# Patient Record
Sex: Female | Born: 1965 | Race: White | Hispanic: No | Smoking: Never smoker
Health system: Southern US, Community
[De-identification: ages and names within clinical notes are randomized; demographics above are authoritative.]

## PROBLEM LIST (undated history)

## (undated) DIAGNOSIS — R112 Nausea with vomiting, unspecified: Secondary | ICD-10-CM

## (undated) DIAGNOSIS — M1990 Unspecified osteoarthritis, unspecified site: Secondary | ICD-10-CM

## (undated) DIAGNOSIS — Z9889 Other specified postprocedural states: Secondary | ICD-10-CM

## (undated) DIAGNOSIS — R51 Headache: Secondary | ICD-10-CM

## (undated) HISTORY — PX: ABLATION: SHX5711

## (undated) HISTORY — PX: LIPOMA EXCISION: SHX5283

## (undated) HISTORY — PX: DIAGNOSTIC LAPAROSCOPY: SUR761

---

## 1987-01-05 HISTORY — PX: TUBAL LIGATION: SHX77

## 1998-07-29 ENCOUNTER — Other Ambulatory Visit: Admission: RE | Admit: 1998-07-29 | Discharge: 1998-07-29 | Payer: Self-pay | Admitting: Obstetrics and Gynecology

## 1998-08-04 ENCOUNTER — Ambulatory Visit (HOSPITAL_COMMUNITY): Admission: RE | Admit: 1998-08-04 | Discharge: 1998-08-04 | Payer: Self-pay | Admitting: Gastroenterology

## 2000-03-29 ENCOUNTER — Other Ambulatory Visit: Admission: RE | Admit: 2000-03-29 | Discharge: 2000-03-29 | Payer: Self-pay | Admitting: Obstetrics and Gynecology

## 2000-05-14 ENCOUNTER — Emergency Department (HOSPITAL_COMMUNITY): Admission: EM | Admit: 2000-05-14 | Discharge: 2000-05-14 | Payer: Self-pay | Admitting: Emergency Medicine

## 2000-05-14 ENCOUNTER — Encounter: Payer: Self-pay | Admitting: Emergency Medicine

## 2000-05-16 ENCOUNTER — Ambulatory Visit (HOSPITAL_COMMUNITY): Admission: RE | Admit: 2000-05-16 | Discharge: 2000-05-16 | Payer: Self-pay | Admitting: Specialist

## 2000-05-16 ENCOUNTER — Encounter: Payer: Self-pay | Admitting: Specialist

## 2001-04-10 ENCOUNTER — Other Ambulatory Visit: Admission: RE | Admit: 2001-04-10 | Discharge: 2001-04-10 | Payer: Self-pay | Admitting: Obstetrics and Gynecology

## 2002-03-05 HISTORY — PX: KNEE ARTHROSCOPY: SUR90

## 2002-08-31 ENCOUNTER — Encounter (INDEPENDENT_AMBULATORY_CARE_PROVIDER_SITE_OTHER): Payer: Self-pay | Admitting: Specialist

## 2002-08-31 ENCOUNTER — Ambulatory Visit (HOSPITAL_BASED_OUTPATIENT_CLINIC_OR_DEPARTMENT_OTHER): Admission: RE | Admit: 2002-08-31 | Discharge: 2002-08-31 | Payer: Self-pay | Admitting: General Surgery

## 2003-01-06 ENCOUNTER — Emergency Department (HOSPITAL_COMMUNITY): Admission: EM | Admit: 2003-01-06 | Discharge: 2003-01-06 | Payer: Self-pay | Admitting: Emergency Medicine

## 2003-12-24 ENCOUNTER — Other Ambulatory Visit: Admission: RE | Admit: 2003-12-24 | Discharge: 2003-12-24 | Payer: Self-pay | Admitting: Obstetrics and Gynecology

## 2005-08-19 ENCOUNTER — Other Ambulatory Visit: Admission: RE | Admit: 2005-08-19 | Discharge: 2005-08-19 | Payer: Self-pay | Admitting: Obstetrics and Gynecology

## 2005-11-19 ENCOUNTER — Inpatient Hospital Stay (HOSPITAL_COMMUNITY): Admission: AD | Admit: 2005-11-19 | Discharge: 2005-11-19 | Payer: Self-pay | Admitting: Obstetrics and Gynecology

## 2009-03-26 ENCOUNTER — Encounter: Admission: RE | Admit: 2009-03-26 | Discharge: 2009-03-26 | Payer: Self-pay | Admitting: Neurology

## 2009-06-30 ENCOUNTER — Emergency Department (HOSPITAL_COMMUNITY): Admission: EM | Admit: 2009-06-30 | Discharge: 2009-07-01 | Payer: Self-pay | Admitting: Emergency Medicine

## 2010-05-22 NOTE — Op Note (Signed)
   NAMEBRANDEY, Dominique Blackwell                        ACCOUNT NO.:  [id number]   MEDICAL RECORD NO.:  [id number]                   PATIENT TYPE:  AMB   LOCATION:  DSC                                  FACILITY:  [facility]   PHYSICIAN:  Gabrielle Dare. Janee Morn, M.D.             DATE OF BIRTH:  07/22/1965   DATE OF PROCEDURE:  08/31/2002  DATE OF DISCHARGE:                                 OPERATIVE REPORT   PREOPERATIVE DIAGNOSIS:  Mass, left leg.   POSTOPERATIVE DIAGNOSIS:  Mass, left leg.   PROCEDURE:  Excision of mass, left leg.   SURGEON:  Gabrielle Dare. Janee Morn, M.D.   ANESTHESIA:  MAC plus local.   INDICATIONS:  The patient is a 45 year old white female who I evaluated in  the office for an uncomfortable mass in her left lower extremity anterior to  her tibia.  The area had been getting larger, and she presents for elective  excision.   DESCRIPTION OF PROCEDURE:  Informed consent had been obtained.  The patient  had been taken to operating room.  MAC anesthesia was administered.  Marcaine 0.25% with epinephrine had been used as a local anesthetic.  Her  left lower leg was prepped and draped in a sterile fashion after she  received intravenous antibiotics.  The site had been marked by the patient.  A vertical incision was made overlying the 2 cm palpable mass.  Subcutaneous  tissue planes were entered and an area of lipomatous tissue in conjunction  with a cystic type lesion on the patient's fascia was noted.  This was  excised circumferentially including a portion of the fascia and sent to  pathology.  Further exploration of the wound revealed no other masses and no  residual abnormal tissue.  The wound was copiously irrigated.  After the  wound was checked and hemostasis was ensured, the incision was closed with a  running 4-0 Vicryl subcuticular stitch.  Sponge, needle and instrument  counts were correct.  Benzoin, Steri-Strips and sterile dressings were  applied.  The patient tolerated the  procedure well and was taken to the  recovery room in stable condition.                                               Gabrielle Dare Janee Morn, M.D.    BET/MEDQ  D:  08/31/2002  T:  09/01/2002  Job:  [id number]

## 2011-10-06 IMAGING — CT CT MAXILLOFACIAL W/O CM
4 of 8 series · 17 of 47 positions shown, 19 images · non-contrast
Comparison: None

CT HEAD

CLINICAL DATA: Fall, right face trauma.

CT HEAD WITHOUT CONTRAST
CT MAXILLOFACIAL WITHOUT CONTRAST
CT CERVICAL SPINE WITHOUT CONTRAST
TECHNIQUE: Multidetector CT imaging of the head, cervical spine,
and maxillofacial structures were performed using the standard
protocol without intravenous contrast. Multiplanar CT image
reconstructions of the cervical spine and maxillofacial structures
were also generated.

[Series 6: recon 2: cervical spine · axial · 0.34mm/px · z∈[-318,-188]mm · 5 of 80 slices shown]
[im 14/80  bone]
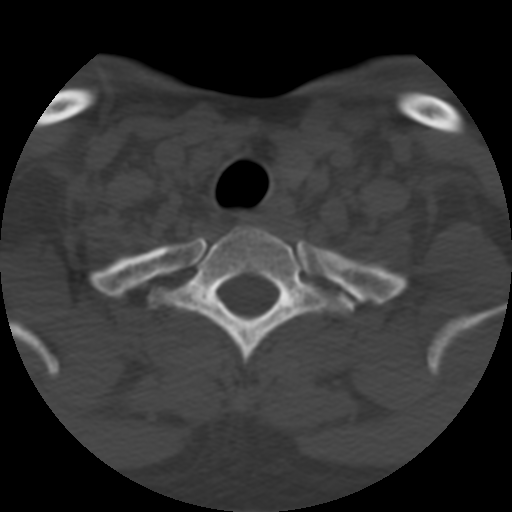
[im 27/80  bone]
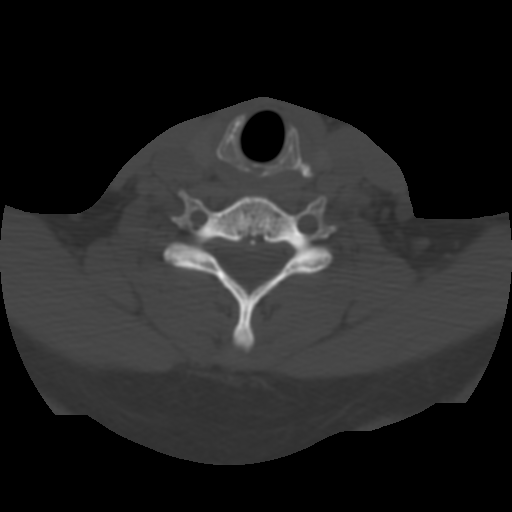
[im 40/80  bone]
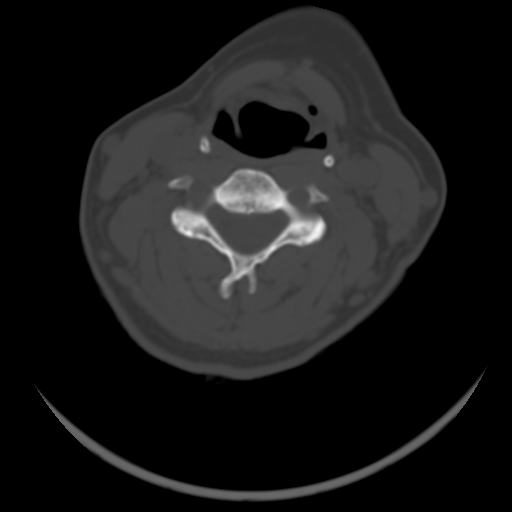
[im 53/80  bone]
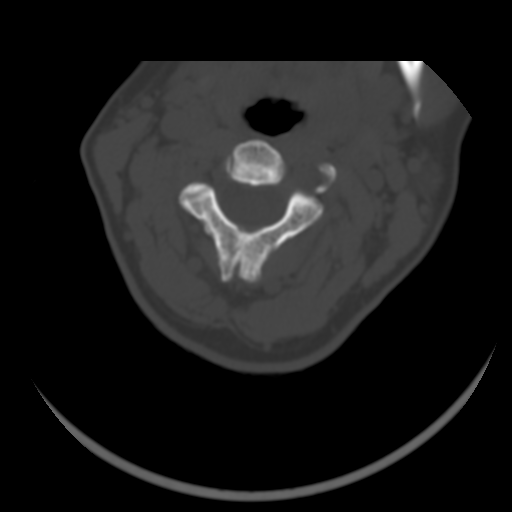
[im 66/80  bone]
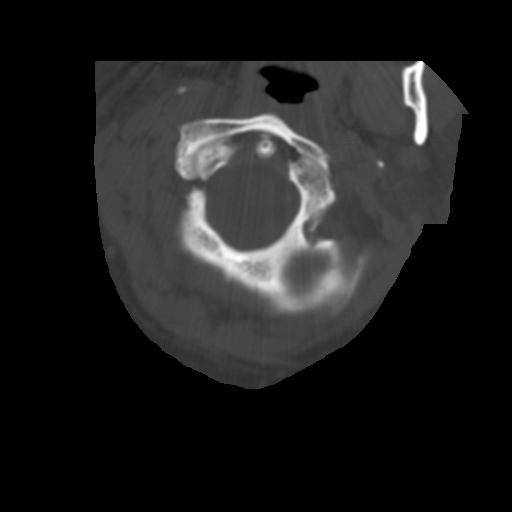

[Series 103: reformatted · coronal · 0.38mm/px · 3 of 71 slices shown (1 of 3)]
[im 15/71  bone]
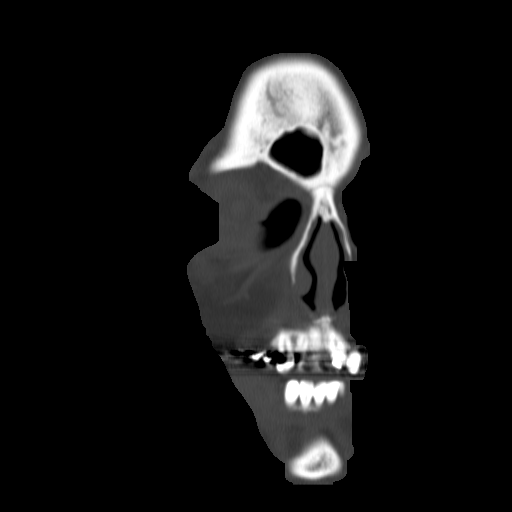
[im 29/71  bone]
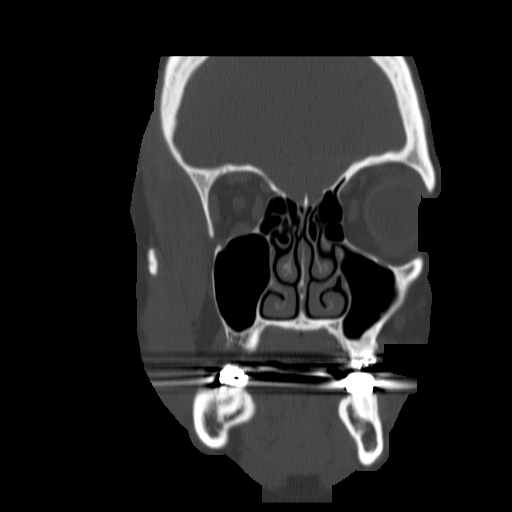
[im 43/71  bone]
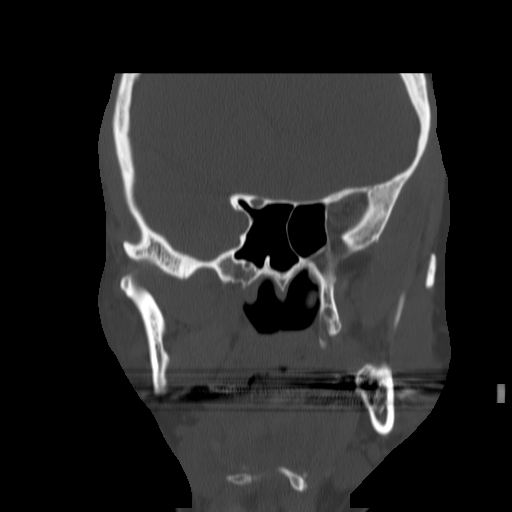

[Series 701: reformatted · sagittal · 0.40mm/px · 3 of 62 slices shown (2 of 3)]
[im 21/62  bone]
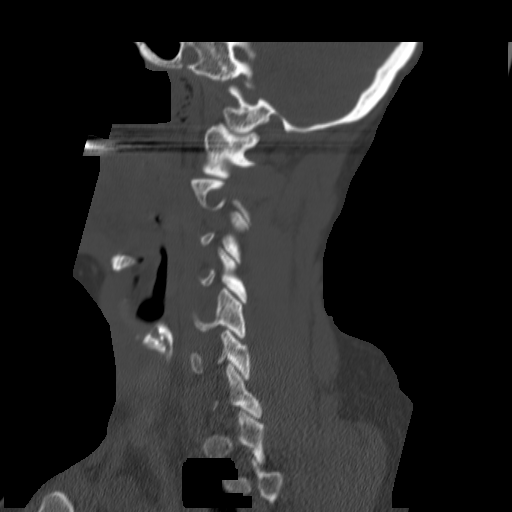
[im 31/62  bone]
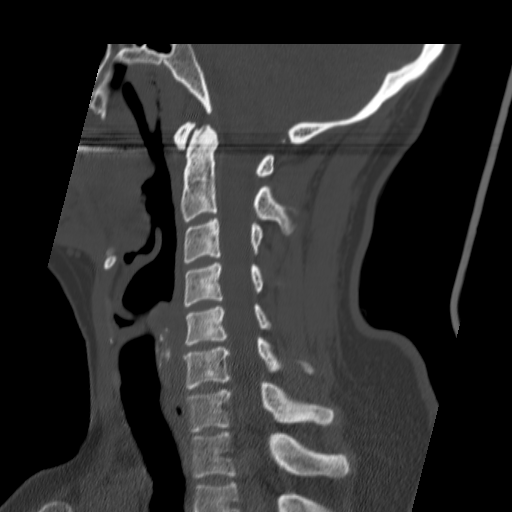
[im 41/62  bone]
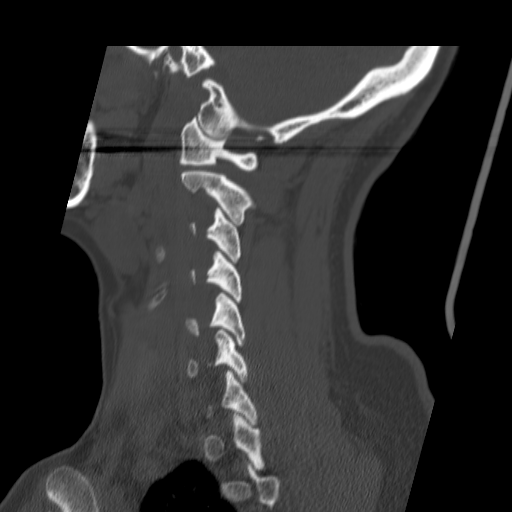

[Series 702: reformatted · axial · 0.40mm/px · z∈[-332,-212]mm · 6 of 89 slices shown, 8 images (3 of 3)]
[im 13/89  brain]
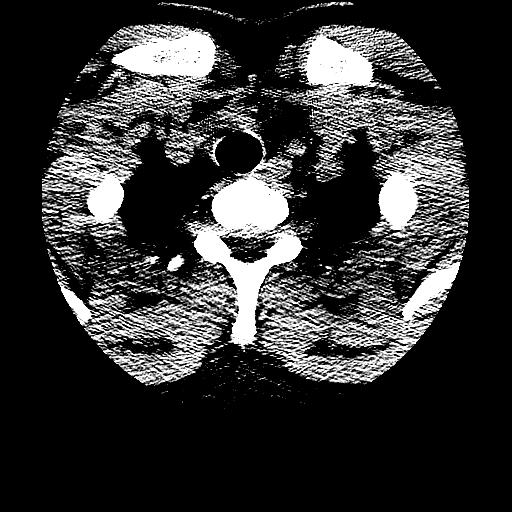
[im 13/89  bone]
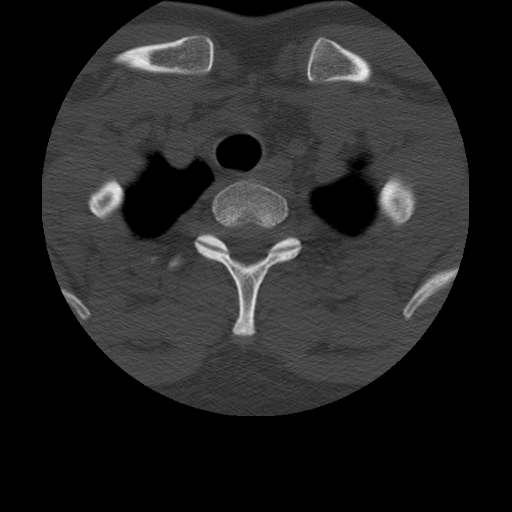
[im 26/89  bone]
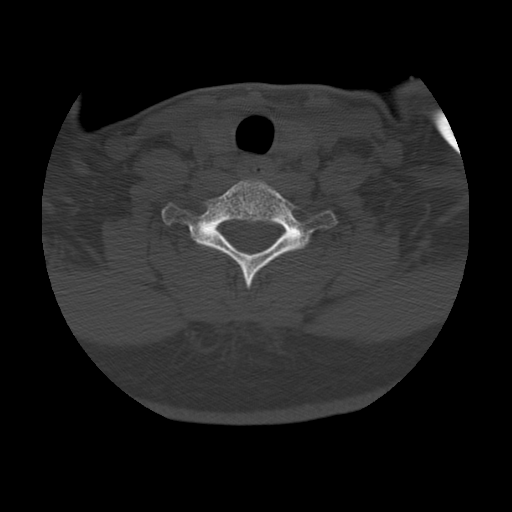
[im 38/89  bone]
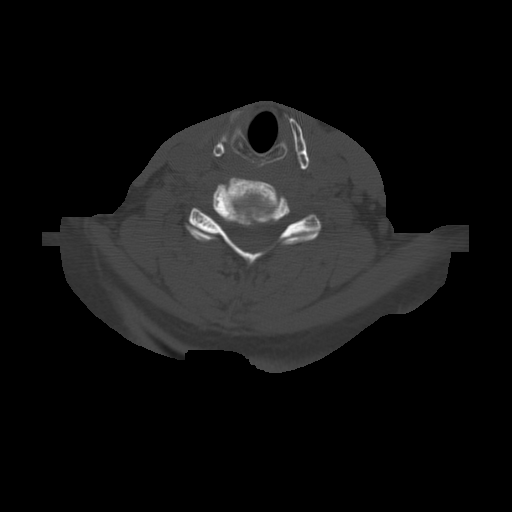
[im 51/89  bone]
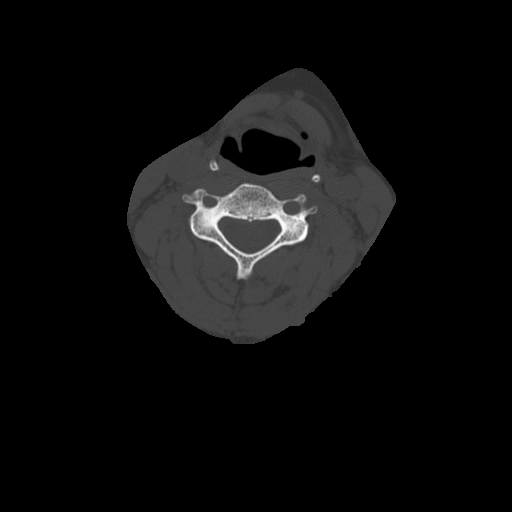
[im 63/89  brain]
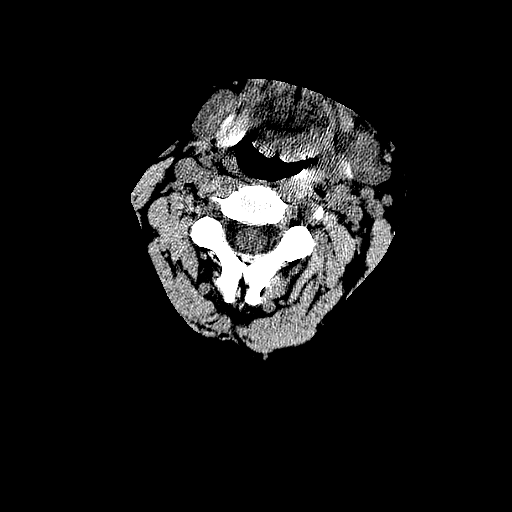
[im 63/89  bone]
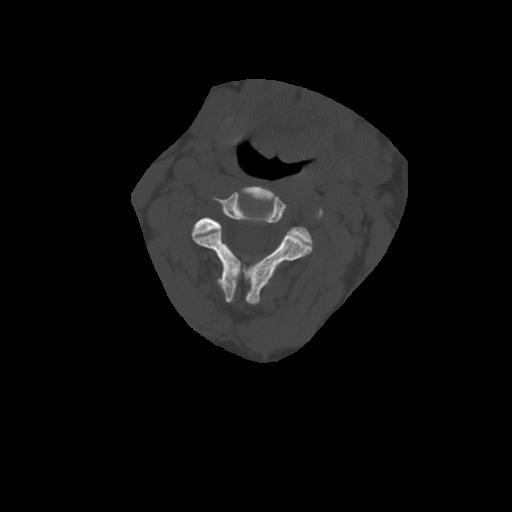
[im 76/89  bone]
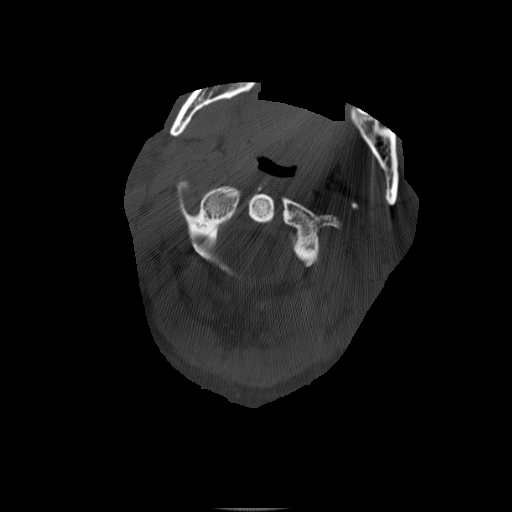

[17 of 47 positions shown; findings below may reference images not displayed]

FINDINGS: No acute intracranial abnormality.  Specifically, no
hemorrhage, hydrocephalus, mass lesion, acute infarction, or
significant intracranial injury.  No acute calvarial abnormality.

Visualized paranasal sinuses and mastoids clear.  Orbital soft
tissues unremarkable.
IMPRESSION: No acute intracranial abnormality.

CT MAXILLOFACIAL
FINDINGS: Soft tissue swelling noted over the right cheek and
orbit.  Orbital soft tissues otherwise unremarkable.  Globes are
intact.  No orbital emphysema.  No evidence of facial fracture.
Paranasal sinuses are clear.
IMPRESSION: No acute bony abnormality.

CT CERVICAL SPINE
FINDINGS: Early degenerative disc disease changes at C5-6 with
disc space narrowing and osteophyte formation.  Normal alignment.
Prevertebral soft tissues are normal.

No fracture.  No epidural or paraspinal hematoma.
IMPRESSION: Early spondylosis.  No acute bony abnormality.

## 2011-10-06 IMAGING — CR DG KNEE COMPLETE 4+V*L*
4 series · 4 of 4 positions shown · non-contrast
Comparison: None

CLINICAL DATA: Fall, knee pain.

LEFT KNEE - COMPLETE 4+ VIEW

[t knee ap left]
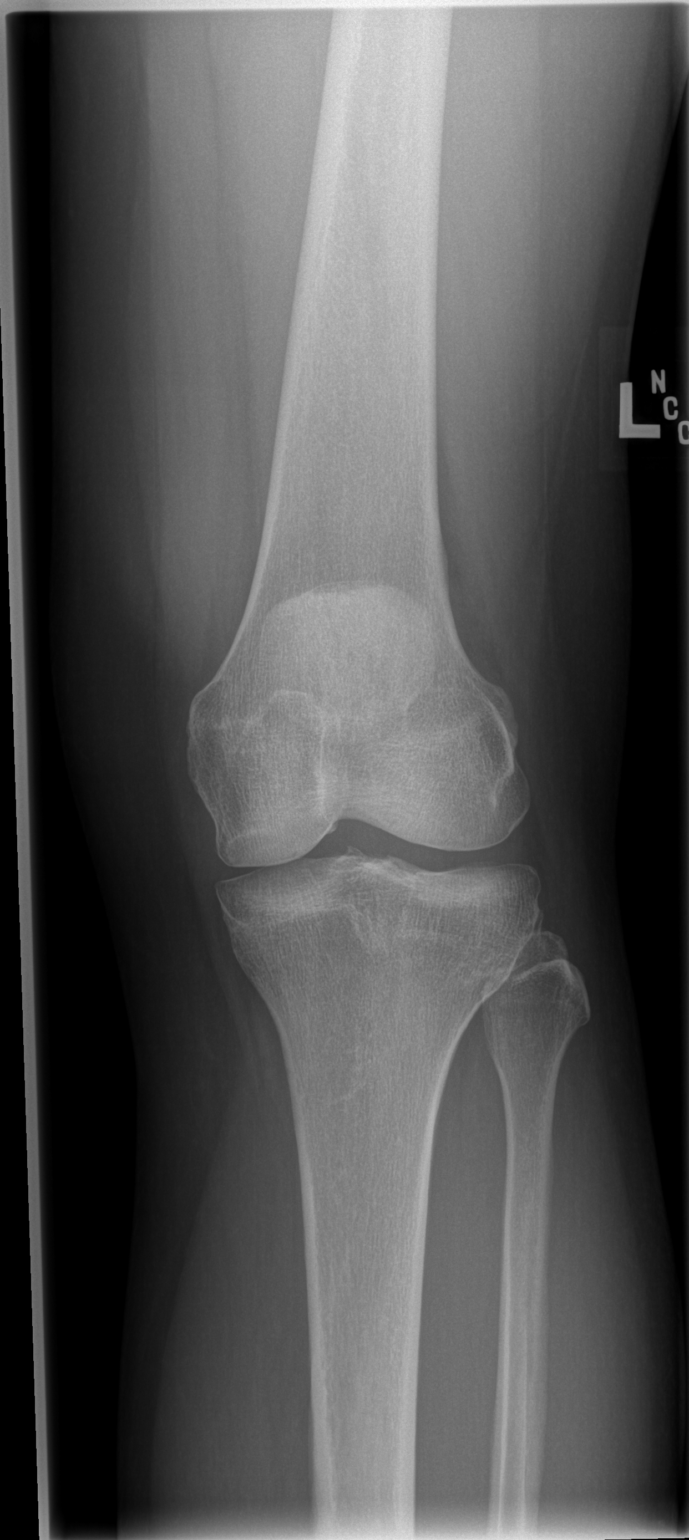

[t knee oblique left (1 of 2)]
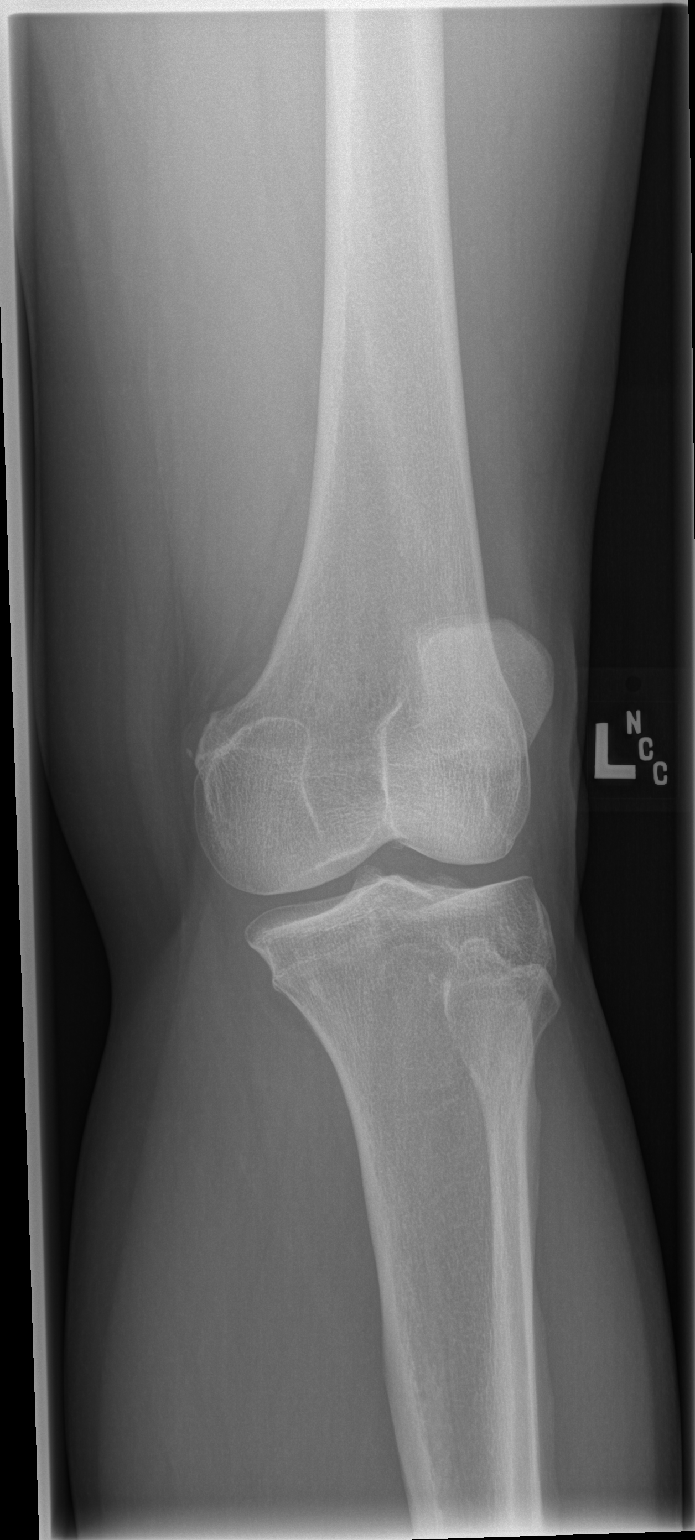

[t knee oblique left (2 of 2)]
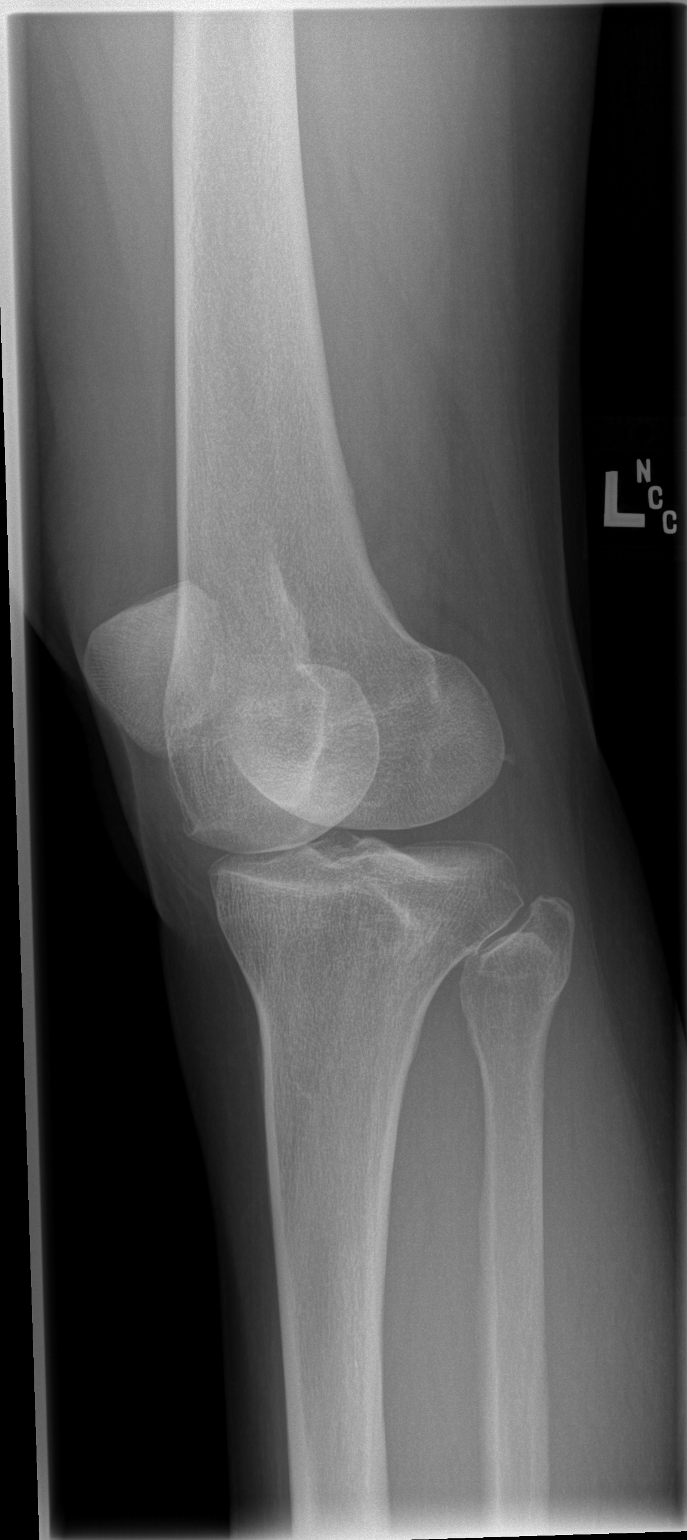

[t knee lat left]
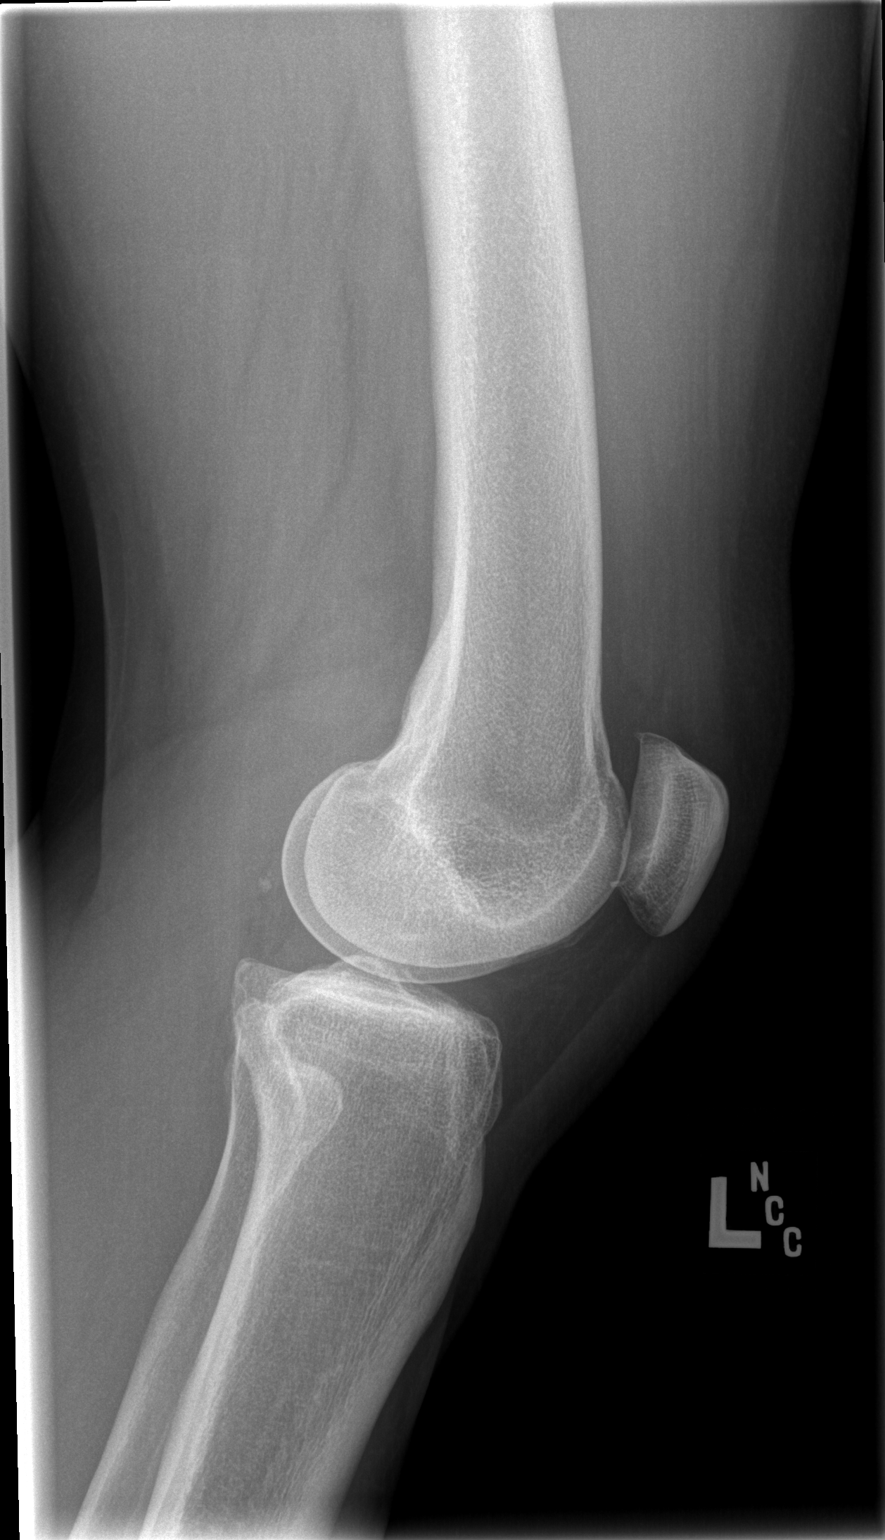

[4 of 4 positions shown; findings below may reference images not displayed]

FINDINGS: Mild degenerative changes in the left knee. No acute bony
abnormality.  Specifically, no fracture, subluxation, or
dislocation.  Soft tissues are intact. No joint effusion.
IMPRESSION: No acute bony abnormality.

## 2012-01-05 HISTORY — PX: JOINT REPLACEMENT: SHX530

## 2012-01-05 HISTORY — PX: OTHER SURGICAL HISTORY: SHX169

## 2012-08-03 ENCOUNTER — Other Ambulatory Visit: Payer: Self-pay | Admitting: Orthopaedic Surgery

## 2012-08-14 ENCOUNTER — Encounter (HOSPITAL_COMMUNITY): Payer: Self-pay | Admitting: Pharmacy Technician

## 2012-08-18 ENCOUNTER — Other Ambulatory Visit: Payer: Self-pay | Admitting: Orthopaedic Surgery

## 2012-08-19 NOTE — Pre-Procedure Instructions (Addendum)
Dominique Blackwell  08/19/2012   Your procedure is scheduled on:  August 26  Report to [facility] at 05:30 AM.  Call this number if you have problems the morning of surgery: [phone]   Remember:   Do not eat food or drink liquids after midnight.   Take these medicines the morning of surgery with A SIP OF WATER: None   STOP Ibuprofen, Multiple Vitamins, Naproxen 08/24/12  Do not take Aspirin, Aleve, Naproxen, Advil, Ibuprofen, Vitamin, Herbs, or Supplements starting 08/24/12  Do not wear jewelry, make-up or nail polish.  Do not wear lotions, powders, or perfumes. You may wear deodorant.  Do not shave 48 hours prior to surgery. Men may shave face and neck.  Do not bring valuables to the hospital.  [facility] is not responsible                   for any belongings or valuables.  Contacts, dentures or bridgework may not be worn into surgery.  Leave suitcase in the car. After surgery it may be brought to your room.  For patients admitted to the hospital, checkout time is 11:00 AM the day of  discharge.   Special Instructions: Shower using CHG 2 nights before surgery and the night before surgery.  If you shower the day of surgery use CHG.  Use special wash - you have one bottle of CHG for all showers.  You should use approximately 1/3 of the bottle for each shower.   Please read over the following fact sheets that you were given: Pain Booklet, Coughing and Deep Breathing, Blood Transfusion Information, Total Joint Packet and Surgical Site Infection Prevention

## 2012-08-21 ENCOUNTER — Encounter (HOSPITAL_COMMUNITY): Payer: Self-pay

## 2012-08-21 ENCOUNTER — Ambulatory Visit (HOSPITAL_COMMUNITY)
Admission: RE | Admit: 2012-08-21 | Discharge: 2012-08-21 | Disposition: A | Discharge status: Home / Self Care | Payer: 59 | Source: Ambulatory Visit | Attending: Orthopaedic Surgery | Admitting: Orthopaedic Surgery

## 2012-08-21 ENCOUNTER — Encounter (HOSPITAL_COMMUNITY)
Admission: RE | Admit: 2012-08-21 | Discharge: 2012-08-21 | Disposition: A | Discharge status: Home / Self Care | Payer: 59 | Source: Ambulatory Visit | Attending: Orthopaedic Surgery | Admitting: Orthopaedic Surgery

## 2012-08-21 DIAGNOSIS — R51 Headache: Secondary | ICD-10-CM | POA: Insufficient documentation

## 2012-08-21 DIAGNOSIS — Z01812 Encounter for preprocedural laboratory examination: Secondary | ICD-10-CM | POA: Insufficient documentation

## 2012-08-21 DIAGNOSIS — M1710 Unilateral primary osteoarthritis, unspecified knee: Secondary | ICD-10-CM | POA: Insufficient documentation

## 2012-08-21 DIAGNOSIS — Z01818 Encounter for other preprocedural examination: Secondary | ICD-10-CM | POA: Insufficient documentation

## 2012-08-21 DIAGNOSIS — Z01810 Encounter for preprocedural cardiovascular examination: Secondary | ICD-10-CM | POA: Insufficient documentation

## 2012-08-21 HISTORY — DX: Unspecified osteoarthritis, unspecified site: M19.90

## 2012-08-21 HISTORY — DX: Headache: R51

## 2012-08-21 HISTORY — DX: Other specified postprocedural states: Z98.890

## 2012-08-21 HISTORY — DX: Other specified postprocedural states: R11.2

## 2012-08-21 LAB — PROTIME-INR
INR: 0.97 (ref 0.00–1.49)
Prothrombin Time: 12.7 seconds (ref 11.6–15.2)

## 2012-08-21 LAB — CBC WITH DIFFERENTIAL/PLATELET
Basophils Relative: 0 % (ref 0–1)
Eosinophils Absolute: 0.2 10*3/uL (ref 0.0–0.7)
Eosinophils Relative: 2 % (ref 0–5)
HCT: 39.4 % (ref 36.0–46.0)
Hemoglobin: 13.6 g/dL (ref 12.0–15.0)
Lymphocytes Relative: 36 % (ref 12–46)
MCH: 30.9 pg (ref 26.0–34.0)
Neutro Abs: 3.8 10*3/uL (ref 1.7–7.7)
Neutrophils Relative %: 52 % (ref 43–77)
RDW: 12.7 % (ref 11.5–15.5)

## 2012-08-21 LAB — BASIC METABOLIC PANEL
Calcium: 9.5 mg/dL (ref 8.4–10.5)
Chloride: 104 mEq/L (ref 96–112)
GFR calc Af Amer: >90 mL/min (ref >90)
GFR calc non Af Amer: >90 mL/min (ref >90)

## 2012-08-21 LAB — URINALYSIS, ROUTINE W REFLEX MICROSCOPIC
Nitrite: NEGATIVE
Specific Gravity, Urine: 1.006 (ref 1.005–1.030)
Urobilinogen, UA: 0.2 mg/dL (ref 0.0–1.0)
pH: 7.5 (ref 5.0–8.0)

## 2012-08-21 LAB — APTT: aPTT: 28 seconds (ref 24–37)

## 2012-08-21 LAB — ABO/RH: ABO/RH(D): A POS

## 2012-08-23 NOTE — H&P (Signed)
TOTAL KNEE ADMISSION H&P  Patient is being admitted for right total knee arthroplasty.  Subjective:  Chief Complaint:right knee pain.  HPI: Dominique Blackwell, 47 y.o. female, has a history of pain and functional disability in the right knee due to arthritis and has failed non-surgical conservative treatments for greater than 12 weeks to includeNSAID's and/or analgesics, corticosteriod injections, viscosupplementation injections and activity modification.  Onset of symptoms was gradual, starting 10 years ago with gradually worsening course since that time. The patient noted prior procedures on the knee to include  arthroscopy on the right knee(s).  Patient currently rates pain in the right knee(s) at 9 out of 10 with activity. Patient has night pain, worsening of pain with activity and weight bearing, pain that interferes with activities of daily living, pain with passive range of motion and crepitus.  Patient has evidence of subchondral sclerosis, periarticular osteophytes and joint space narrowing by imaging studies. This patient has had knee arthroscopy. There is no active infection.  There are no active problems to display for this patient.  Past Medical History  Diagnosis Date  . PONV (postoperative nausea and vomiting)   . Headache(784.0)     hx migraines  . Arthritis     Past Surgical History  Procedure Laterality Date  . Thrumb Left 14    trigger  . Knee arthroscopy Right 03,04    infection 04  . Ablation      uterine  . Tubal ligation  89  . Lipoma excision Left     leg  . 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 Smoking status: Never Smoker   . Smokeless tobacco: Not on file     Comment: quit alcohol 2 yrs  occ social  . Alcohol Use: No    No family history on file.   Review of Systems  Musculoskeletal: Positive for joint pain.  All  other systems reviewed and are negative.    Objective:  Physical Exam  Constitutional: She is oriented to person, place, and time. She appears well-nourished.  HENT:  Head: Normocephalic.  Eyes: Pupils are equal, round, and reactive to light.  Neck: Normal range of motion.  Cardiovascular: Regular rhythm.   Respiratory: Breath sounds normal.  GI: Bowel sounds are normal.  Musculoskeletal:  Right knee exam: Several well healed incisions.  Range of motion is 0-1 25.  Pain along the medial joint line severe with crepitation 1+.  No fluid.  Normal neurovascular status otherwise to her toes.  Good hip motion.  Neurological: She is oriented to person, place, and time.  Skin: Skin is dry.  Psychiatric: She has a normal mood and affect.    Vital signs in last 24 hours:    Labs:   There is no height or weight on file to calculate BMI.   Imaging Review Plain radiographs demonstrate severe degenerative joint disease of the right knee(s). The overall alignment isneutral. The bone quality appears to be good for age and reported activity level.  Assessment/Plan:  End stage arthritis, right knee   The patient history, physical examination, clinical judgment of the provider and imaging studies are consistent with end stage degenerative joint disease of the right knee(s) and total knee arthroplasty is deemed medically necessary. The treatment options including medical management, injection therapy arthroscopy and arthroplasty were discussed at length. The risks and  benefits of total knee arthroplasty were presented and reviewed. The risks due to aseptic loosening, infection, stiffness, patella tracking problems, thromboembolic complications and other imponderables were discussed. The patient acknowledged the explanation, agreed to proceed with the plan and consent was signed. Patient is being admitted for inpatient treatment for surgery, pain control, PT, OT, prophylactic antibiotics, VTE  prophylaxis, progressive ambulation and ADL's and discharge planning. The patient is planning to be discharged home with home health services

## 2012-08-28 MED ORDER — CEFAZOLIN SODIUM-DEXTROSE 2-3 GM-% IV SOLR
2.0000 g | INTRAVENOUS | Status: AC
  Administered 2012-08-29: 2 g via INTRAVENOUS
  Filled 2012-08-28: qty 50

## 2012-08-29 ENCOUNTER — Encounter (HOSPITAL_COMMUNITY): Payer: Self-pay

## 2012-08-29 ENCOUNTER — Inpatient Hospital Stay (HOSPITAL_COMMUNITY): Payer: 59 | Admitting: Anesthesiology

## 2012-08-29 ENCOUNTER — Inpatient Hospital Stay (HOSPITAL_COMMUNITY)
Admission: RE | Admit: 2012-08-29 | Discharge: 2012-08-31 | DRG: 470 | Disposition: A | Discharge status: Home Health | Payer: 59 | Source: Ambulatory Visit | Attending: Orthopaedic Surgery | Admitting: Orthopaedic Surgery

## 2012-08-29 ENCOUNTER — Encounter (HOSPITAL_COMMUNITY)
Admission: RE | Disposition: A | Discharge status: Home Health | Payer: Self-pay | Source: Ambulatory Visit | Attending: Orthopaedic Surgery

## 2012-08-29 ENCOUNTER — Encounter (HOSPITAL_COMMUNITY): Payer: Self-pay | Admitting: Anesthesiology

## 2012-08-29 DIAGNOSIS — Z7982 Long term (current) use of aspirin: Secondary | ICD-10-CM

## 2012-08-29 DIAGNOSIS — M1710 Unilateral primary osteoarthritis, unspecified knee: Principal | ICD-10-CM | POA: Diagnosis present

## 2012-08-29 DIAGNOSIS — Z79899 Other long term (current) drug therapy: Secondary | ICD-10-CM

## 2012-08-29 DIAGNOSIS — M1711 Unilateral primary osteoarthritis, right knee: Secondary | ICD-10-CM | POA: Diagnosis present

## 2012-08-29 DIAGNOSIS — Z9851 Tubal ligation status: Secondary | ICD-10-CM

## 2012-08-29 HISTORY — PX: TOTAL KNEE ARTHROPLASTY: SHX125

## 2012-08-29 SURGERY — ARTHROPLASTY, KNEE, TOTAL
Anesthesia: Regional | Site: Knee | Laterality: Right | Wound class: Clean

## 2012-08-29 MED ORDER — LACTATED RINGERS IV SOLN: INTRAVENOUS | Status: DC

## 2012-08-29 MED ORDER — LACTATED RINGERS IV SOLN
INTRAVENOUS | Status: DC | PRN
  Administered 2012-08-29 (×2): via INTRAVENOUS

## 2012-08-29 MED ORDER — LIDOCAINE HCL (CARDIAC) 20 MG/ML IV SOLN
INTRAVENOUS | Status: DC | PRN
  Administered 2012-08-29: 60 mg via INTRAVENOUS

## 2012-08-29 MED ORDER — OXYCODONE HCL 5 MG PO TABS
ORAL_TABLET | ORAL | Status: AC
  Administered 2012-08-29: 5 mg
  Filled 2012-08-29: qty 1

## 2012-08-29 MED ORDER — HYDROMORPHONE HCL PF 1 MG/ML IJ SOLN
INTRAMUSCULAR | Status: AC
  Filled 2012-08-29: qty 1

## 2012-08-29 MED ORDER — PHENOL 1.4 % MT LIQD: 1.0000 | OROMUCOSAL | Status: DC | PRN

## 2012-08-29 MED ORDER — ONDANSETRON HCL 4 MG/2ML IJ SOLN: 4.0000 mg | Freq: Four times a day (QID) | INTRAMUSCULAR | Status: DC | PRN

## 2012-08-29 MED ORDER — PROPOFOL 10 MG/ML IV BOLUS
INTRAVENOUS | Status: DC | PRN
  Administered 2012-08-29: 180 mg via INTRAVENOUS

## 2012-08-29 MED ORDER — ZOLPIDEM TARTRATE 5 MG PO TABS: 5.0000 mg | ORAL_TABLET | Freq: Every evening | ORAL | Status: DC | PRN

## 2012-08-29 MED ORDER — DEXAMETHASONE SODIUM PHOSPHATE 10 MG/ML IJ SOLN
INTRAMUSCULAR | Status: DC | PRN
  Administered 2012-08-29: 8 mg via INTRAVENOUS

## 2012-08-29 MED ORDER — ARTIFICIAL TEARS OP OINT
TOPICAL_OINTMENT | OPHTHALMIC | Status: DC | PRN
  Administered 2012-08-29: 1 via OPHTHALMIC

## 2012-08-29 MED ORDER — METHOCARBAMOL 500 MG PO TABS
500.0000 mg | ORAL_TABLET | Freq: Four times a day (QID) | ORAL | Status: DC | PRN
  Administered 2012-08-29 – 2012-08-31 (×4): 500 mg via ORAL
  Filled 2012-08-29 (×4): qty 1

## 2012-08-29 MED ORDER — MENTHOL 3 MG MT LOZG: 1.0000 | LOZENGE | OROMUCOSAL | Status: DC | PRN

## 2012-08-29 MED ORDER — DIPHENHYDRAMINE HCL 12.5 MG/5ML PO ELIX: 12.5000 mg | ORAL_SOLUTION | ORAL | Status: DC | PRN

## 2012-08-29 MED ORDER — FENTANYL CITRATE 0.05 MG/ML IJ SOLN
INTRAMUSCULAR | Status: DC | PRN
  Administered 2012-08-29: 25 ug via INTRAVENOUS
  Administered 2012-08-29: 50 ug via INTRAVENOUS
  Administered 2012-08-29 (×3): 25 ug via INTRAVENOUS
  Administered 2012-08-29 (×2): 50 ug via INTRAVENOUS

## 2012-08-29 MED ORDER — PROPOFOL 10 MG/ML IV BOLUS: INTRAVENOUS | Status: DC | PRN

## 2012-08-29 MED ORDER — ASPIRIN EC 325 MG PO TBEC
325.0000 mg | DELAYED_RELEASE_TABLET | Freq: Two times a day (BID) | ORAL | Status: DC
  Administered 2012-08-30 – 2012-08-31 (×3): 325 mg via ORAL
  Filled 2012-08-29 (×5): qty 1

## 2012-08-29 MED ORDER — SODIUM CHLORIDE 0.9 % IR SOLN
Status: DC | PRN
  Administered 2012-08-29: 3000 mL

## 2012-08-29 MED ORDER — ACETAMINOPHEN 325 MG PO TABS: 650.0000 mg | ORAL_TABLET | Freq: Four times a day (QID) | ORAL | Status: DC | PRN

## 2012-08-29 MED ORDER — 0.9 % SODIUM CHLORIDE (POUR BTL) OPTIME
TOPICAL | Status: DC | PRN
  Administered 2012-08-29: 1000 mL

## 2012-08-29 MED ORDER — METOCLOPRAMIDE HCL 5 MG/ML IJ SOLN
5.0000 mg | Freq: Three times a day (TID) | INTRAMUSCULAR | Status: DC | PRN
  Administered 2012-08-29 – 2012-08-30 (×2): 10 mg via INTRAVENOUS
  Filled 2012-08-29 (×2): qty 2

## 2012-08-29 MED ORDER — ALUM & MAG HYDROXIDE-SIMETH 200-200-20 MG/5ML PO SUSP: 30.0000 mL | ORAL | Status: DC | PRN

## 2012-08-29 MED ORDER — ONDANSETRON HCL 4 MG/2ML IJ SOLN
4.0000 mg | Freq: Four times a day (QID) | INTRAMUSCULAR | Status: DC | PRN
  Administered 2012-08-29 – 2012-08-30 (×2): 4 mg via INTRAVENOUS
  Filled 2012-08-29 (×2): qty 2

## 2012-08-29 MED ORDER — ACETAMINOPHEN 650 MG RE SUPP: 650.0000 mg | Freq: Four times a day (QID) | RECTAL | Status: DC | PRN

## 2012-08-29 MED ORDER — CHLORHEXIDINE GLUCONATE 4 % EX LIQD
60.0000 mL | Freq: Once | CUTANEOUS | Status: DC
Start: 2012-08-29 — End: 2012-08-29

## 2012-08-29 MED ORDER — DOCUSATE SODIUM 100 MG PO CAPS
100.0000 mg | ORAL_CAPSULE | Freq: Two times a day (BID) | ORAL | Status: DC
  Administered 2012-08-29 – 2012-08-31 (×4): 100 mg via ORAL
  Filled 2012-08-29 (×5): qty 1

## 2012-08-29 MED ORDER — METHOCARBAMOL 500 MG PO TABS
ORAL_TABLET | ORAL | Status: AC
  Administered 2012-08-29: 500 mg
  Filled 2012-08-29: qty 1

## 2012-08-29 MED ORDER — MIDAZOLAM HCL 5 MG/5ML IJ SOLN
INTRAMUSCULAR | Status: DC | PRN
  Administered 2012-08-29: 2 mg via INTRAVENOUS

## 2012-08-29 MED ORDER — METOCLOPRAMIDE HCL 10 MG PO TABS: 5.0000 mg | ORAL_TABLET | Freq: Three times a day (TID) | ORAL | Status: DC | PRN

## 2012-08-29 MED ORDER — OXYCODONE HCL 5 MG PO TABS
5.0000 mg | ORAL_TABLET | ORAL | Status: DC | PRN
  Administered 2012-08-29 – 2012-08-31 (×11): 10 mg via ORAL
  Filled 2012-08-29 (×12): qty 2
  Filled 2012-08-29: qty 1

## 2012-08-29 MED ORDER — METHOCARBAMOL 100 MG/ML IJ SOLN
500.0000 mg | Freq: Four times a day (QID) | INTRAVENOUS | Status: DC | PRN
  Filled 2012-08-29: qty 5

## 2012-08-29 MED ORDER — PHENYLEPHRINE HCL 10 MG/ML IJ SOLN
INTRAMUSCULAR | Status: DC | PRN
  Administered 2012-08-29 (×2): 40 ug via INTRAVENOUS

## 2012-08-29 MED ORDER — OXYCODONE HCL 5 MG PO TABS: 5.0000 mg | ORAL_TABLET | Freq: Once | ORAL | Status: DC | PRN

## 2012-08-29 MED ORDER — BISACODYL 10 MG RE SUPP: 10.0000 mg | Freq: Every day | RECTAL | Status: DC | PRN

## 2012-08-29 MED ORDER — CEFAZOLIN SODIUM-DEXTROSE 2-3 GM-% IV SOLR
2.0000 g | Freq: Four times a day (QID) | INTRAVENOUS | Status: DC
  Filled 2012-08-29: qty 50

## 2012-08-29 MED ORDER — CEFAZOLIN SODIUM-DEXTROSE 2-3 GM-% IV SOLR
2.0000 g | Freq: Four times a day (QID) | INTRAVENOUS | Status: AC
  Administered 2012-08-29 (×2): 2 g via INTRAVENOUS
  Filled 2012-08-29 (×2): qty 50

## 2012-08-29 MED ORDER — LACTATED RINGERS IV SOLN
INTRAVENOUS | Status: DC
  Administered 2012-08-29: 13:00:00 via INTRAVENOUS

## 2012-08-29 MED ORDER — HYDROMORPHONE HCL PF 1 MG/ML IJ SOLN
0.2500 mg | INTRAMUSCULAR | Status: DC | PRN
  Administered 2012-08-29 (×4): 0.5 mg via INTRAVENOUS

## 2012-08-29 MED ORDER — TRANEXAMIC ACID 100 MG/ML IV SOLN
1000.0000 mg | INTRAVENOUS | Status: AC
  Administered 2012-08-29: 1000 mg via INTRAVENOUS
  Filled 2012-08-29: qty 10

## 2012-08-29 MED ORDER — FERROUS SULFATE 325 (65 FE) MG PO TABS
325.0000 mg | ORAL_TABLET | Freq: Every day | ORAL | Status: DC
  Administered 2012-08-30 – 2012-08-31 (×2): 325 mg via ORAL
  Filled 2012-08-29 (×3): qty 1

## 2012-08-29 MED ORDER — OXYCODONE HCL 5 MG/5ML PO SOLN: 5.0000 mg | Freq: Once | ORAL | Status: DC | PRN

## 2012-08-29 MED ORDER — ONDANSETRON HCL 4 MG PO TABS: 4.0000 mg | ORAL_TABLET | Freq: Four times a day (QID) | ORAL | Status: DC | PRN

## 2012-08-29 MED ORDER — HYDROMORPHONE HCL PF 1 MG/ML IJ SOLN
0.5000 mg | INTRAMUSCULAR | Status: DC | PRN
  Administered 2012-08-29: 0.5 mg via INTRAVENOUS
  Administered 2012-08-29: 1 mg via INTRAVENOUS
  Administered 2012-08-30: 0.5 mg via INTRAVENOUS
  Filled 2012-08-29 (×3): qty 1

## 2012-08-29 MED ORDER — ONDANSETRON HCL 4 MG/2ML IJ SOLN
INTRAMUSCULAR | Status: DC | PRN
  Administered 2012-08-29 (×2): 4 mg via INTRAVENOUS

## 2012-08-29 MED ORDER — BUPIVACAINE-EPINEPHRINE PF 0.5-1:200000 % IJ SOLN
INTRAMUSCULAR | Status: DC | PRN
  Administered 2012-08-29: 30 mL

## 2012-08-29 MED ORDER — HYDROMORPHONE HCL PF 1 MG/ML IJ SOLN
INTRAMUSCULAR | Status: DC | PRN
  Administered 2012-08-29 (×2): 0.5 mg via INTRAVENOUS

## 2012-08-29 SURGICAL SUPPLY — 70 items
BANDAGE ELASTIC 4 VELCRO ST LF (GAUZE/BANDAGES/DRESSINGS) ×2 IMPLANT
BANDAGE ESMARK 6X9 LF (GAUZE/BANDAGES/DRESSINGS) ×1 IMPLANT
BANDAGE GAUZE ELAST BULKY 4 IN (GAUZE/BANDAGES/DRESSINGS) ×4 IMPLANT
BLADE SAGITTAL 25.0X1.19X90 (BLADE) ×2 IMPLANT
BLADE SURG ROTATE 9660 (MISCELLANEOUS) IMPLANT
BNDG CMPR 9X6 STRL LF SNTH (GAUZE/BANDAGES/DRESSINGS) ×1
BNDG CMPR MED 10X6 ELC LF (GAUZE/BANDAGES/DRESSINGS) ×1
BNDG ELASTIC 6X10 VLCR STRL LF (GAUZE/BANDAGES/DRESSINGS) ×2 IMPLANT
BNDG ESMARK 6X9 LF (GAUZE/BANDAGES/DRESSINGS) ×2
BOWL SMART MIX CTS (DISPOSABLE) ×2 IMPLANT
CAP UPCHARGE REVISION TRAY ×1 IMPLANT
CAPT RP KNEE ×1 IMPLANT
CEMENT HV SMART SET (Cement) ×4 IMPLANT
CLOTH BEACON ORANGE TIMEOUT ST (SAFETY) ×2 IMPLANT
CLSR STERI-STRIP ANTIMIC 1/2X4 (GAUZE/BANDAGES/DRESSINGS) ×1 IMPLANT
COVER SURGICAL LIGHT HANDLE (MISCELLANEOUS) ×2 IMPLANT
CUFF TOURNIQUET SINGLE 34IN LL (TOURNIQUET CUFF) ×2 IMPLANT
CUFF TOURNIQUET SINGLE 44IN (TOURNIQUET CUFF) IMPLANT
DRAPE EXTREMITY T 121X128X90 (DRAPE) ×2 IMPLANT
DRAPE PROXIMA HALF (DRAPES) ×2 IMPLANT
DRAPE U-SHAPE 47X51 STRL (DRAPES) ×2 IMPLANT
DRSG ADAPTIC 3X8 NADH LF (GAUZE/BANDAGES/DRESSINGS) ×2 IMPLANT
DRSG PAD ABDOMINAL 8X10 ST (GAUZE/BANDAGES/DRESSINGS) ×2 IMPLANT
DURAPREP 26ML APPLICATOR (WOUND CARE) ×3 IMPLANT
ELECT REM PT RETURN 9FT ADLT (ELECTROSURGICAL) ×2
ELECTRODE REM PT RTRN 9FT ADLT (ELECTROSURGICAL) ×1 IMPLANT
FACESHIELD LNG OPTICON STERILE (SAFETY) ×4 IMPLANT
GLOVE BIO SURGEON STRL SZ7 (GLOVE) ×2 IMPLANT
GLOVE BIO SURGEON STRL SZ8.5 (GLOVE) ×2 IMPLANT
GLOVE BIOGEL PI IND STRL 6.5 (GLOVE) IMPLANT
GLOVE BIOGEL PI IND STRL 7.0 (GLOVE) IMPLANT
GLOVE BIOGEL PI IND STRL 8 (GLOVE) ×1 IMPLANT
GLOVE BIOGEL PI IND STRL 8.5 (GLOVE) ×1 IMPLANT
GLOVE BIOGEL PI INDICATOR 6.5 (GLOVE) ×1
GLOVE BIOGEL PI INDICATOR 7.0 (GLOVE) ×3
GLOVE BIOGEL PI INDICATOR 8 (GLOVE) ×1
GLOVE BIOGEL PI INDICATOR 8.5 (GLOVE) ×1
GLOVE SS BIOGEL STRL SZ 8 (GLOVE) ×1 IMPLANT
GLOVE SUPERSENSE BIOGEL SZ 8 (GLOVE) ×1
GOWN PREVENTION PLUS XLARGE (GOWN DISPOSABLE) ×3 IMPLANT
GOWN PREVENTION PLUS XXLARGE (GOWN DISPOSABLE) ×2 IMPLANT
GOWN STRL NON-REIN LRG LVL3 (GOWN DISPOSABLE) ×2 IMPLANT
GOWN STRL REIN XL XLG (GOWN DISPOSABLE) ×2 IMPLANT
HANDPIECE INTERPULSE COAX TIP (DISPOSABLE) ×2
HOOD PEEL AWAY FACE SHEILD DIS (HOOD) ×2 IMPLANT
IMMOBILIZER KNEE 20 (SOFTGOODS)
IMMOBILIZER KNEE 20 THIGH 36 (SOFTGOODS) IMPLANT
IMMOBILIZER KNEE 22 UNIV (SOFTGOODS) ×2 IMPLANT
IMMOBILIZER KNEE 24 THIGH 36 (MISCELLANEOUS) IMPLANT
IMMOBILIZER KNEE 24 UNIV (MISCELLANEOUS)
KIT BASIN OR (CUSTOM PROCEDURE TRAY) ×2 IMPLANT
KIT ROOM TURNOVER OR (KITS) ×2 IMPLANT
MANIFOLD NEPTUNE II (INSTRUMENTS) ×2 IMPLANT
NS IRRIG 1000ML POUR BTL (IV SOLUTION) ×2 IMPLANT
PACK TOTAL JOINT (CUSTOM PROCEDURE TRAY) ×2 IMPLANT
PAD ARMBOARD 7.5X6 YLW CONV (MISCELLANEOUS) ×4 IMPLANT
SET HNDPC FAN SPRY TIP SCT (DISPOSABLE) ×1 IMPLANT
SPONGE GAUZE 4X4 12PLY (GAUZE/BANDAGES/DRESSINGS) ×2 IMPLANT
STAPLER VISISTAT 35W (STAPLE) IMPLANT
SUCTION FRAZIER TIP 10 FR DISP (SUCTIONS) ×1 IMPLANT
SUT MNCRL AB 3-0 PS2 18 (SUTURE) ×1 IMPLANT
SUT VIC AB 0 CT1 27 (SUTURE) ×4
SUT VIC AB 0 CT1 27XBRD ANBCTR (SUTURE) ×2 IMPLANT
SUT VIC AB 2-0 CT1 27 (SUTURE) ×4
SUT VIC AB 2-0 CT1 TAPERPNT 27 (SUTURE) ×2 IMPLANT
SUT VLOC 180 0 24IN GS25 (SUTURE) ×2 IMPLANT
TOWEL OR 17X24 6PK STRL BLUE (TOWEL DISPOSABLE) ×2 IMPLANT
TOWEL OR 17X26 10 PK STRL BLUE (TOWEL DISPOSABLE) ×2 IMPLANT
TRAY FOLEY CATH 16FRSI W/METER (SET/KITS/TRAYS/PACK) ×2 IMPLANT
WATER STERILE IRR 1000ML POUR (IV SOLUTION) ×4 IMPLANT

## 2012-08-29 NOTE — Op Note (Signed)
PREOP DIAGNOSIS: DJD RIGHT KNEE POSTOP DIAGNOSIS: same PROCEDURE: RIGHT TKR ANESTHESIA: General and block ATTENDING SURGEON: Kyrollos Cordell G ASSISTANT: Lindwood Qua PA and Garwin Brothers RNFA  INDICATIONS FOR PROCEDURE: Dominique Blackwell is a 47 y.o. female who has struggled for a long time with pain due to degenerative arthritis of the right knee.  The patient has failed many conservative non-operative measures and at this point has pain which limits the ability to sleep and walk.  The patient is offered total knee replacement.  Informed operative consent was obtained after discussion of possible risks of anesthesia, infection, neurovascular injury, DVT, and death.  The importance of the post-operative rehabilitation protocol to optimize result was stressed extensively with the patient.  SUMMARY OF FINDINGS AND PROCEDURE:  Dominique Blackwell was taken to the operative suite where under the above anesthesia a right knee replacement was performed.  There were advanced degenerative changes and the bone quality was excellent.  We used the DePuy system and placed size standard plus femur, tibia, 35 mm all polyethylene patella, and a size 10 mm spacer.  The patient was admitted for appropriate post-op care to include perioperative antibiotics and mechanical and pharmacologic measures for DVT prophylaxis.  DESCRIPTION OF PROCEDURE:  Dominique Blackwell was taken to the operative suite where the above anesthesia was applied.  The patient was positioned supine and prepped and draped in normal sterile fashion.  An appropriate time out was performed.  After the administration of Kefzol pre-op antibiotic the leg was elevated and exsanguinated and a tourniquet inflated. A standard longitudinal incision was made on the anterior knee.  Dissection was carried down to the extensor mechanism.  All appropriate anti-infective measures were used including the pre-operative antibiotic, betadine impregnated drape, and  closed hooded exhaust systems for each member of the surgical team.  A medial parapatellar incision was made in the extensor mechanism and the knee cap flipped and the knee flexed.  Some residual meniscal tissues were removed along with any remaining ACL/PCL tissue.  A guide was placed on the tibia and a flat cut was made on it's superior surface.  An intramedullary guide was placed in the femur and was utilized to make anterior and posterior cuts creating an appropriate flexion gap.  A second intramedullary guide was placed in the femur to make a distal cut properly balancing the knee with an extension gap equal to the flexion gap.  The three bones sized to the above mentioned sizes and the appropriate guides were placed and utilized.  A trial reduction was done and the knee easily came to full extension and the patella tracked well on flexion.  The trial components were removed and all bones were cleaned with pulsatile lavage and then dried thoroughly.  Cement was mixed and was pressurized onto the bones followed by placement of the aforementioned components.  Excess cement was trimmed and pressure was held on the components until the cement had hardened.  The tourniquet was deflated and a small amount of bleeding was controlled with cautery and pressure.  The knee was irrigated thoroughly.  The extensor mechanism was re-approximated with V-loc suture in running fashion.  The knee was flexed and the repair was solid.  The subcutaneous tissues were re-approximated with #0 and #2-0 vicryl and the skin closed with a subcuticular stitch and steristrips.  A sterile dressing was applied.  Intraoperative fluids, EBL, and tourniquet time can be obtained from anesthesia records. We did administer tranexamic acid during the case.  DISPOSITION:  The patient was taken to recovery room in stable condition and admitted for appropriate post-op care to include peri-operative antibiotic and DVT prophylaxis with mechanical and  pharmacologic measures.  Karin Griffith G 08/29/2012, 9:45 AM

## 2012-08-29 NOTE — Progress Notes (Signed)
Pt husband brought back for a PACU visit since we are on hold waiting. He was very upset that this was a scheduled surgery & there is no bed. Explanation provided. Jonna Munro AD advised of this.

## 2012-08-29 NOTE — Progress Notes (Signed)
Received patient from PACU.  Patient arousable, complains of pain level 6/10; vitals stable.  Patient and husband oriented to room and unit.  Will continue to monitor.

## 2012-08-29 NOTE — Progress Notes (Signed)
Orthopedic Tech Progress Note Patient Details:  Dominique Blackwell 10/30/1965 [id number] CPM applied to Right LE with appropriate settings.  CPM Right Knee CPM Right Knee: On Right Knee Flexion (Degrees): 60 Right Knee Extension (Degrees): 0   Asia R Thompson 08/29/2012, 12:26 PM

## 2012-08-29 NOTE — Anesthesia Preprocedure Evaluation (Addendum)
Anesthesia Evaluation  Patient identified by MRN, date of birth, ID band Patient awake    Reviewed: Allergy & Precautions, H&P , NPO status , Patient's Chart, lab work & pertinent test results  History of Anesthesia Complications (+) PONV  Airway Mallampati: II  Neck ROM: full    Dental   Pulmonary          Cardiovascular     Neuro/Psych  Headaches,    GI/Hepatic   Endo/Other    Renal/GU      Musculoskeletal  (+) Arthritis -,   Abdominal   Peds  Hematology   Anesthesia Other Findings   Reproductive/Obstetrics                           Anesthesia Physical Anesthesia Plan  ASA: II  Anesthesia Plan: General and Regional   Post-op Pain Management: MAC Combined w/ Regional for Post-op pain   Induction: Intravenous  Airway Management Planned: LMA  Additional Equipment:   Intra-op Plan:   Post-operative Plan:   Informed Consent: I have reviewed the patients History and Physical, chart, labs and discussed the procedure including the risks, benefits and alternatives for the proposed anesthesia with the patient or authorized representative who has indicated his/her understanding and acceptance.     Plan Discussed with: CRNA, Anesthesiologist and Surgeon  Anesthesia Plan Comments:        Anesthesia Quick Evaluation

## 2012-08-29 NOTE — Progress Notes (Signed)
UR COMPLETED  

## 2012-08-29 NOTE — Interval H&P Note (Signed)
History and Physical Interval Note:  08/29/2012 7:38 AM  Dominique Blackwell  has presented today for surgery, with the diagnosis of Right Knee DJD  The various methods of treatment have been discussed with the patient and family. After consideration of risks, benefits and other options for treatment, the patient has consented to  Procedure(s): RIGHT TOTAL KNEE ARTHROPLASTY (Right) as a surgical intervention .  The patient's history has been reviewed, patient examined, no change in status, stable for surgery.  I have reviewed the patient's chart and labs.  Questions were answered to the patient's satisfaction.     Dominique Blackwell G

## 2012-08-29 NOTE — Evaluation (Signed)
Physical Therapy Evaluation Patient Details Name: Dominique Blackwell MRN: [id number] DOB: September 22, 1965 Today's Date: 08/29/2012 Time: 7829-5621 PT Time Calculation (min): 16 min  PT Assessment / Plan / Recommendation History of Present Illness     Clinical Impression  Patient is s/p R TKR surgery resulting in functional limitations due to the deficits listed below (see PT Problem List).  Patient will benefit from skilled PT to increase their independence and safety with mobility to allow discharge to the venue listed below.  Pt with nausea however agreeable to try to mobilize.  Pt only able to ambulate a few feet before becoming very nauseated and small amount of emesis.  Pt then assisted back to bed however pt reported feeling much better.       PT Assessment  Patient needs continued PT services    Follow Up Recommendations  Home health PT    Does the patient have the potential to tolerate intense rehabilitation      Barriers to Discharge        Equipment Recommendations  None recommended by PT    Recommendations for Other Services     Frequency 7X/week    Precautions / Restrictions Precautions Precautions: Knee Restrictions Weight Bearing Restrictions: Yes RLE Weight Bearing: Weight bearing as tolerated   Pertinent Vitals/Pain Pt states minimal pain, premedicated, repositioned to comfort     Mobility  Bed Mobility Bed Mobility: Supine to Sit;Sit to Supine Supine to Sit: 4: Min assist;With rails Sit to Supine: 4: Min assist;With rail Details for Bed Mobility Assistance: assist for R LE  Transfers Transfers: Stand to Sit;Sit to Stand Sit to Stand: 4: Min assist;With upper extremity assist;From bed;From chair/3-in-1 Stand to Sit: 4: Min assist;With upper extremity assist;To bed;To chair/3-in-1 Details for Transfer Assistance: assist to rise and control descent, verbal cues for safe technique including R LE forward, pt with nausea so chair brought behind pt, pt with dry  heaving and then small amount of clear liquid emesis, pt reported feeling better and assisted back to bed Ambulation/Gait Ambulation/Gait Assistance: 4: Min assist Ambulation Distance (Feet): 4 Feet (x2) Assistive device: Rolling walker Ambulation/Gait Assistance Details: mostly min/guard until nausea, verbal cues for technique, sequence, step length, RW distance Gait Pattern: Step-to pattern;Antalgic    Exercises     PT Diagnosis: Difficulty walking  PT Problem List: Decreased strength;Decreased range of motion;Decreased mobility;Pain;Decreased knowledge of use of DME PT Treatment Interventions: Stair training;Gait training;DME instruction;Patient/family education;Functional mobility training;Therapeutic exercise;Therapeutic activities     PT Goals(Current goals can be found in the care plan section) Acute Rehab PT Goals PT Goal Formulation: With patient Time For Goal Achievement: 09/05/12 Potential to Achieve Goals: Good  Visit Information  Last PT Received On: 08/29/12 Assistance Needed: +1       Prior Functioning  Home Living Family/patient expects to be discharged to:: Private residence Living Arrangements: Spouse/significant other;Children Type of Home: House Home Layout: Two level;Bed/bath upstairs Alternate Level Stairs-Number of Steps: flight Alternate Level Stairs-Rails: Right Home Equipment: Walker - 2 wheels Prior Function Level of Independence: Independent Communication Communication: No difficulties    Cognition  Cognition Arousal/Alertness: Awake/alert Behavior During Therapy: WFL for tasks assessed/performed Overall Cognitive Status: Within Functional Limits for tasks assessed    Extremity/Trunk Assessment Lower Extremity Assessment Lower Extremity Assessment: RLE deficits/detail RLE Deficits / Details: required assist for mobility, approx -8-30* at least per moving LE in bed however not formally tested RLE: Unable to fully assess due to pain    Balance  End of Session PT - End of Session Equipment Utilized During Treatment: Right knee immobilizer Activity Tolerance: Other (comment) (limited by nausea) Patient left: in bed;with call bell/phone within reach CPM Right Knee CPM Right Knee: Off (removed by PT 1520) Right Knee Flexion (Degrees): 60 Right Knee Extension (Degrees): 0  GP     Elishah Ashmore,KATHrine E 08/29/2012, 4:23 PM Zenovia Jarred, PT, DPT 08/29/2012 Pager: [phone]

## 2012-08-29 NOTE — Anesthesia Postprocedure Evaluation (Signed)
Anesthesia Post Note  Patient: Dominique Blackwell  Procedure(s) Performed: Procedure(s) (LRB): RIGHT TOTAL KNEE ARTHROPLASTY (Right)  Anesthesia type: General  Patient location: PACU  Post pain: Pain level controlled and Adequate analgesia  Post assessment: Post-op Vital signs reviewed, Patient's Cardiovascular Status Stable, Respiratory Function Stable, Patent Airway and Pain level controlled  Last Vitals:  Filed Vitals:   08/29/12 1054  BP:   Pulse: 71  Temp:   Resp: 12    Post vital signs: Reviewed and stable  Level of consciousness: awake, alert  and oriented  Complications: No apparent anesthesia complications

## 2012-08-29 NOTE — Transfer of Care (Signed)
Immediate Anesthesia Transfer of Care Note  Patient: Dominique Blackwell  Procedure(s) Performed: Procedure(s): RIGHT TOTAL KNEE ARTHROPLASTY (Right)  Patient Location: PACU  Anesthesia Type:General  Level of Consciousness: awake, alert  and oriented  Airway & Oxygen Therapy: Patient Spontanous Breathing and Patient connected to nasal cannula oxygen  Post-op Assessment: Report given to PACU RN, Post -op Vital signs reviewed and stable and Patient moving all extremities  Post vital signs: Reviewed and stable  Complications: No apparent anesthesia complications

## 2012-08-29 NOTE — Anesthesia Procedure Notes (Addendum)
Anesthesia Regional Block:  Femoral nerve block  Pre-Anesthetic Checklist: ,, timeout performed, Correct Patient, Correct Site, Correct Laterality, Correct Procedure,, site marked, risks and benefits discussed, Surgical consent,  Pre-op evaluation,  At surgeon's request and post-op pain management  Laterality: Right  Prep: chloraprep       Needles:  Injection technique: Single-shot  Needle Type: Echogenic Stimulator Needle     Needle Length: 9cm  Needle Gauge: 21 and 21 G    Additional Needles:  Procedures: nerve stimulator Femoral nerve block  Nerve Stimulator or Paresthesia:  Response: Quadriceps muscle contraction, 0.45 mA,   Additional Responses:   Narrative:  Start time: 08/29/2012 7:13 AM End time: 08/29/2012 7:21 AM Injection made incrementally with aspirations every 5 mL.  Performed by: Personally  Anesthesiologist: Dr Chaney Malling  Additional Notes: Functioning IV was confirmed and monitors were applied.  A 90mm 21ga Arrow echogenic stimulator needle was used. Sterile prep and drape,hand hygiene and sterile gloves were used.  Negative aspiration and negative test dose prior to incremental administration of local anesthetic. The patient tolerated the procedure well.    Femoral nerve block Procedure Name: LMA Insertion Date/Time: 08/29/2012 7:49 AM Performed by: Elizbeth Squires R Pre-anesthesia Checklist: Patient identified, Emergency Drugs available, Suction available and Patient being monitored Patient Re-evaluated:Patient Re-evaluated prior to inductionOxygen Delivery Method: Circle system utilized Preoxygenation: Pre-oxygenation with 100% oxygen Intubation Type: IV induction LMA: LMA inserted LMA Size: 4.0 Number of attempts: 1 Tube secured with: Tape Dental Injury: Teeth and Oropharynx as per pre-operative assessment

## 2012-08-30 ENCOUNTER — Encounter (HOSPITAL_COMMUNITY): Payer: Self-pay | Admitting: Orthopaedic Surgery

## 2012-08-30 LAB — CBC
Hemoglobin: 11.8 g/dL — ABNORMAL LOW (ref 12.0–15.0)
MCH: 30.2 pg (ref 26.0–34.0)
MCHC: 33.7 g/dL (ref 30.0–36.0)
MCV: 89.5 fL (ref 78.0–100.0)
Platelets: 256 10*3/uL (ref 150–400)
RBC: 3.91 MIL/uL (ref 3.87–5.11)

## 2012-08-30 LAB — GLUCOSE, CAPILLARY

## 2012-08-30 LAB — BASIC METABOLIC PANEL
CO2: 26 mEq/L (ref 19–32)
Calcium: 8.8 mg/dL (ref 8.4–10.5)
Creatinine, Ser: 0.66 mg/dL (ref 0.50–1.10)
GFR calc non Af Amer: >90 mL/min (ref >90)
Glucose, Bld: 96 mg/dL (ref 70–99)
Sodium: 136 mEq/L (ref 135–145)

## 2012-08-30 NOTE — Progress Notes (Signed)
Physical Therapy Treatment Patient Details Name: Dominique Blackwell MRN: [id number] DOB: 07/20/1965 Today's Date: 08/30/2012 Time: 8295-6213 PT Time Calculation (min): 25 min  PT Assessment / Plan / Recommendation  History of Present Illness Pt with R TKR   PT Comments   Patient progressing with ambulation this session. States the pain is better this afternoon. Will continue with current POC and anticipate DC tomorrow  Follow Up Recommendations  Home health PT     Does the patient have the potential to tolerate intense rehabilitation     Barriers to Discharge        Equipment Recommendations  None recommended by PT    Recommendations for Other Services    Frequency     Progress towards PT Goals Progress towards PT goals: Progressing toward goals  Plan Current plan remains appropriate    Precautions / Restrictions Precautions Precautions: Knee Restrictions Weight Bearing Restrictions: Yes RLE Weight Bearing: Weight bearing as tolerated   Pertinent Vitals/Pain no apparent distress     Mobility  Bed Mobility Bed Mobility: Not assessed Transfers Sit to Stand: With upper extremity assist;From chair/3-in-1;4: Min guard;From toilet Stand to Sit: To chair/3-in-1;To toilet;4: Min guard Details for Transfer Assistance: Cues for safe hand placement and safe technique for sit - stand Ambulation/Gait Ambulation/Gait Assistance: 4: Min guard Ambulation Distance (Feet): 80 Feet Assistive device: Rolling walker Ambulation/Gait Assistance Details: CUes for posture Gait Pattern: Step-to pattern    Exercises Total Joint Exercises Quad Sets: AROM;Right;10 reps Heel Slides: AAROM;Right;10 reps Hip ABduction/ADduction: AAROM;Right;10 reps Straight Leg Raises: AAROM;Right;10 reps Long Arc Quad: AAROM;Right;10 reps   PT Diagnosis:    PT Problem List:   PT Treatment Interventions:     PT Goals (current goals can now be found in the care plan section) Acute Rehab PT Goals Patient  Stated Goal: " To return home "  Visit Information  Last PT Received On: 08/30/12 Assistance Needed: +1 History of Present Illness: Pt with R TKR    Subjective Data  Patient Stated Goal: " To return home "   Cognition  Cognition Arousal/Alertness: Awake/alert Behavior During Therapy: WFL for tasks assessed/performed Overall Cognitive Status: Within Functional Limits for tasks assessed    Balance  Balance Balance Assessed: Yes Dynamic Standing Balance Dynamic Standing - Balance Support: During functional activity Dynamic Standing - Level of Assistance: 5: Stand by assistance;4: Min assist  End of Session PT - End of Session Equipment Utilized During Treatment: Gait belt Activity Tolerance: Patient tolerated treatment well Patient left: in chair;with call bell/phone within reach;with family/visitor present CPM Right Knee CPM Right Knee: Off   GP     Fredrich Birks 08/30/2012, 2:27 PM 08/30/2012 Fredrich Birks PTA [phone] pager [phone] office

## 2012-08-30 NOTE — Progress Notes (Signed)
08/30/12 Patient set up with HHPT/OT with [facility] by MD office. Spoke with patient, no change in discharge plan. [employer] has delivered CPM, 3N1 and rolling walker to her home. Jacquelynn Cree RN, BSN, CCM

## 2012-08-30 NOTE — Evaluation (Signed)
Occupational Therapy Evaluation Patient Details Name: Dominique Blackwell MRN: [id number] DOB: [birth year] Today's Date: 08/30/2012 Time:  -     OT Assessment / Plan / Recommendation History of present illness Pt with R TKR   Clinical Impression   Pt demos decline in function with ADLs and ADL mobility safety following R TKR. Pt would benefit from acute OT services to address impairments to help restore PLOF to return home safely    OT Assessment  Patient needs continued OT Services    Follow Up Recommendations  Home health OT;Supervision/Assistance - 24 hour    Barriers to Discharge   none  Equipment Recommendations       Recommendations for Other Services    Frequency  Min 2X/week    Precautions / Restrictions Precautions Precautions: Knee Restrictions Weight Bearing Restrictions: Yes RLE Weight Bearing: Weight bearing as tolerated   Pertinent Vitals/Pain no apparent distress     ADL  Grooming: Performed;Wash/dry hands;Wash/dry face;Min guard Upper Body Bathing: Simulated;Set up;Supervision/safety Lower Body Bathing: Simulated;Moderate assistance Upper Body Dressing: Performed;Supervision/safety;Set up Lower Body Dressing: Performed;Moderate assistance Toilet Transfer: Performed;Min Pension scheme manager Method: Sit to Barista: Raised toilet seat with arms (or 3-in-1 over toilet);Grab bars Toileting - Clothing Manipulation and Hygiene: Performed;Min guard;Minimal assistance Where Assessed - Engineer, mining and Hygiene: Standing Tub/Shower Transfer Method: Not assessed Equipment Used: Long-handled shoe horn;Reacher;Rolling walker;Knee Immobilizer;Long-handled sponge ADL Comments: pt educated on ADL A/E and provided with demo    OT Diagnosis: Generalized weakness  OT Problem List:   OT Treatment Interventions: Self-care/ADL training;Therapeutic exercise;Patient/family education;Balance training;Therapeutic activities;DME and/or  AE instruction   OT Goals(Current goals can be found in the care plan section) Acute Rehab OT Goals Patient Stated Goal: " To return home " Time For Goal Achievement: 09/06/12 Potential to Achieve Goals: Good ADL Goals Pt Will Perform Grooming: with set-up;with supervision;standing Pt Will Perform Lower Body Bathing: with min assist;with caregiver independent in assisting;with adaptive equipment Pt Will Perform Lower Body Dressing: with min assist;with caregiver independent in assisting;with adaptive equipment Pt Will Transfer to Toilet: with supervision;ambulating Pt Will Perform Toileting - Clothing Manipulation and hygiene: with supervision;with caregiver independent in assisting;sit to/from stand Pt Will Perform Tub/Shower Transfer: with min guard assist;shower seat  Visit Information  Last OT Received On: 08/30/12 Assistance Needed: +1 History of Present Illness: Pt with R TKR       Prior Functioning     Home Living Family/patient expects to be discharged to:: Private residence Living Arrangements: Spouse/significant other;Children Type of Home: House Home Layout: Two level;Bed/bath upstairs Alternate Level Stairs-Number of Steps: flight Alternate Level Stairs-Rails: Right Home Equipment: Walker - 2 wheels Prior Function Level of Independence: Independent Communication Communication: No difficulties Dominant Hand: Right         Vision/Perception Vision - History Baseline Vision: Wears glasses only for reading Patient Visual Report: No change from baseline Perception Perception: Within Functional Limits   Cognition  Cognition Arousal/Alertness: Awake/alert Behavior During Therapy: WFL for tasks assessed/performed Overall Cognitive Status: Within Functional Limits for tasks assessed    Extremity/Trunk Assessment Upper Extremity Assessment Upper Extremity Assessment: Overall WFL for tasks assessed     Mobility Bed Mobility Bed Mobility: Not  assessed Transfers Sit to Stand: With upper extremity assist;From chair/3-in-1;4: Min guard;From toilet Stand to Sit: To chair/3-in-1;To toilet;4: Min guard Details for Transfer Assistance: Cues for safe hand placement and safe technique for sit - stand        Balance Balance Balance Assessed: Yes  Dynamic Standing Balance Dynamic Standing - Balance Support: During functional activity Dynamic Standing - Level of Assistance: 5: Stand by assistance;4: Min assist   End of Session OT - End of Session Equipment Utilized During Treatment: Rolling walker;Right knee immobilizer Patient left: in chair;with call bell/phone within reach CPM Right Knee CPM Right Knee: Off  GO     Margaretmary Eddy [facility] 08/30/2012, 2:27 PM

## 2012-08-30 NOTE — Progress Notes (Signed)
Physical Therapy Treatment Patient Details Name: MURRY KHIEV MRN: [id number] DOB: 1965-05-12 Today's Date: 08/30/2012 Time: 4098-1191 PT Time Calculation (min): 29 min  PT Assessment / Plan / Recommendation  History of Present Illness     PT Comments   Patient without nausea this morning and able to progress with therapy. Husband present throughout and helpful. Will work on increasing ambulation distance and safety this afternoon  Follow Up Recommendations  Home health PT     Does the patient have the potential to tolerate intense rehabilitation     Barriers to Discharge        Equipment Recommendations  None recommended by PT    Recommendations for Other Services    Frequency 7X/week   Progress towards PT Goals Progress towards PT goals: Progressing toward goals  Plan Current plan remains appropriate    Precautions / Restrictions Precautions Precautions: Knee Restrictions Weight Bearing Restrictions: Yes RLE Weight Bearing: Weight bearing as tolerated   Pertinent Vitals/Pain 8/10 R knee pain. patient repositioned for comfort     Mobility  Bed Mobility Supine to Sit: 4: Min assist;With rails Details for Bed Mobility Assistance: assist for R LE  Transfers Sit to Stand: With upper extremity assist;From bed;From chair/3-in-1;4: Min guard Stand to Sit: With upper extremity assist;To chair/3-in-1;4: Min guard Details for Transfer Assistance: CUes for safe hand placement and not to pull up from RW.  Ambulation/Gait Ambulation/Gait Assistance: 4: Min assist Ambulation Distance (Feet): 50 Feet Assistive device: Rolling walker Ambulation/Gait Assistance Details: Cues for sequency and technique with RW Gait Pattern: Step-to pattern Gait velocity: decreased    Exercises Total Joint Exercises Quad Sets: AROM;Right;10 reps Heel Slides: AAROM;Right;10 reps Hip ABduction/ADduction: AAROM;Right;10 reps Straight Leg Raises: AAROM;Right;10 reps Long Arc Quad:  AAROM;Right;10 reps   PT Diagnosis:    PT Problem List:   PT Treatment Interventions:     PT Goals (current goals can now be found in the care plan section)    Visit Information  Last PT Received On: 08/30/12 Assistance Needed: +1    Subjective Data      Cognition  Cognition Arousal/Alertness: Awake/alert Behavior During Therapy: WFL for tasks assessed/performed Overall Cognitive Status: Within Functional Limits for tasks assessed    Balance     End of Session PT - End of Session Equipment Utilized During Treatment: Gait belt Activity Tolerance: Patient tolerated treatment well Patient left: in chair;with call bell/phone within reach;with family/visitor present CPM Right Knee CPM Right Knee: On Right Knee Flexion (Degrees): 60 Right Knee Extension (Degrees): 0   GP     Fredrich Birks 08/30/2012, 9:44 AM  08/30/2012 Fredrich Birks PTA [phone] pager [phone] office

## 2012-08-30 NOTE — Progress Notes (Signed)
Orthopedic Tech Progress Note Patient Details:  Dominique Blackwell 1965-03-13 [id number]  Patient ID: [name], female   DOB: 10-Apr-1965, 47 y.o.   MRN: [id number] Placed pt's lle in cpm @0 -40 degrees as tolerated; rn notified  Dominique Blackwell 08/30/2012, 8:07 PM

## 2012-08-30 NOTE — Plan of Care (Signed)
Problem: Phase III Progression Outcomes Goal: Discharge plan remains appropriate-arrangements made Recommend HH OT for ADL trg and ADL mobility safety trg after acute care d/c     

## 2012-08-30 NOTE — Progress Notes (Signed)
Subjective: 1 Day Post-Op Procedure(s) (LRB): RIGHT TOTAL KNEE ARTHROPLASTY (Right)  Activity level:  Bedrest/CPM Diet tolerance:  N/v all night Voiding:  Foley just removed Patient reports pain as moderate.    Objective: Vital signs in last 24 hours: Temp:  [97.5 F (36.4 C)-98.2 F (36.8 C)] 98.2 F (36.8 C) (08/27 4696) Pulse Rate:  [71-108] 77 (08/27 0613) Resp:  [9-19] 16 (08/27 0613) BP: (118-136)/(54-75) 118/67 mmHg (08/27 0613) SpO2:  [98 %-100 %] 98 % (08/27 0613)  Labs:  Recent Labs  08/30/12 0520  HGB 11.8*    Recent Labs  08/30/12 0520  WBC 15.3*  RBC 3.91  HCT 35.0*  PLT 256    Recent Labs  08/30/12 0520  NA 136  K 3.6  CL 102  CO2 26  BUN 7  CREATININE 0.66  GLUCOSE 96  CALCIUM 8.8   No results found for this basename: LABPT, INR,  in the last 72 hours  Physical Exam:  Neurologically intact ABD soft Sensation intact distally Intact pulses distally Dorsiflexion/Plantar flexion intact Compartment soft  Assessment/Plan:  1 Day Post-Op Procedure(s) (LRB): RIGHT TOTAL KNEE ARTHROPLASTY (Right) Advance diet Up with therapy hct stable Hopefully home in 1-2 days D/c iv if tolerates breakfast well    Dominique Blackwell G 08/30/2012, 7:29 AM

## 2012-08-31 LAB — CBC
Hemoglobin: 11.9 g/dL — ABNORMAL LOW (ref 12.0–15.0)
MCH: 30.3 pg (ref 26.0–34.0)
MCHC: 33.4 g/dL (ref 30.0–36.0)
MCV: 90.6 fL (ref 78.0–100.0)
RBC: 3.93 MIL/uL (ref 3.87–5.11)

## 2012-08-31 MED ORDER — METHOCARBAMOL 500 MG PO TABS: 500.0000 mg | ORAL_TABLET | Freq: Four times a day (QID) | ORAL | Status: DC | PRN

## 2012-08-31 MED ORDER — OXYCODONE HCL 5 MG PO TABS: 5.0000 mg | ORAL_TABLET | ORAL | Status: DC | PRN

## 2012-08-31 MED ORDER — ASPIRIN 325 MG PO TBEC: 325.0000 mg | DELAYED_RELEASE_TABLET | Freq: Two times a day (BID) | ORAL | Status: DC

## 2012-08-31 NOTE — Progress Notes (Signed)
OT Cancellation Note  Patient Details Name: Dominique Blackwell MRN: [id number] DOB: 1965/09/16   Cancelled Treatment:    Reason Eval/Treat Not Completed: Other (comment) (Pt to d/c home today, declined OT)  Galen Manila 08/31/2012, 2:43 PM

## 2012-08-31 NOTE — Progress Notes (Signed)
Physical Therapy Treatment Patient Details Name: AVAIAH STEMPEL MRN: [id number] DOB: [birth year] Today's Date: 08/31/2012 Time: 5621-3086 PT Time Calculation (min): 30 min  PT Assessment / Plan / Recommendation  History of Present Illness Pt with R TKR   PT Comments   Patient continues to state that she is feeling better. Able to increase ambulation this session. Patient has a flight of steps to get to bedroom. Will attempt steps this afternoon.   Follow Up Recommendations  Home health PT     Does the patient have the potential to tolerate intense rehabilitation     Barriers to Discharge        Equipment Recommendations  None recommended by PT    Recommendations for Other Services    Frequency 7X/week   Progress towards PT Goals Progress towards PT goals: Progressing toward goals  Plan Current plan remains appropriate    Precautions / Restrictions Precautions Precautions: Knee Restrictions Weight Bearing Restrictions: Yes RLE Weight Bearing: Weight bearing as tolerated   Pertinent Vitals/Pain 3/10 R knee pain. Patient was premedicated   Mobility  Bed Mobility Supine to Sit: 6: Modified independent (Device/Increase time) Transfers Sit to Stand: 5: Supervision;From bed Stand to Sit: 5: Supervision;To chair/3-in-1 Details for Transfer Assistance: Cues for safe hand placement Ambulation/Gait Ambulation/Gait Assistance: 4: Min guard Ambulation Distance (Feet): 120 Feet Assistive device: Rolling walker Ambulation/Gait Assistance Details: Cues for posture, R heel strike and to increase weight through LEs Gait Pattern: Step-to pattern Gait velocity: decreased    Exercises Total Joint Exercises Quad Sets: AROM;Right;10 reps Heel Slides: AAROM;Right;10 reps Hip ABduction/ADduction: Right;10 reps;AROM Straight Leg Raises: AAROM;Right;10 reps Long Arc Quad: AAROM;Right;10 reps   PT Diagnosis:    PT Problem List:   PT Treatment Interventions:     PT Goals (current  goals can now be found in the care plan section)    Visit Information  Last PT Received On: 08/31/12 Assistance Needed: +1 History of Present Illness: Pt with R TKR    Subjective Data      Cognition  Cognition Arousal/Alertness: Awake/alert Behavior During Therapy: WFL for tasks assessed/performed Overall Cognitive Status: Within Functional Limits for tasks assessed    Balance     End of Session PT - End of Session Equipment Utilized During Treatment: Gait belt Activity Tolerance: Patient tolerated treatment well Patient left: in chair;with call bell/phone within reach;with family/visitor present CPM Right Knee CPM Right Knee: On Right Knee Flexion (Degrees): 40 Right Knee Extension (Degrees): 0   GP     Fredrich Birks 08/31/2012, 11:28 AM  08/31/2012 Fredrich Birks PTA [phone] pager [phone] office

## 2012-08-31 NOTE — Progress Notes (Signed)
Subjective: 2 Days Post-Op Procedure(s) (LRB): RIGHT TOTAL KNEE ARTHROPLASTY (Right)  Activity level:  Doing well with therapy. If she can pass steps today she can be discharged this afternoon. Diet tolerance:  Eating regular, no nausea vomiting Voiding:  Voiding Patient reports pain as 2 on 0-10 scale.    Objective: Vital signs in last 24 hours: Temp:  [98.6 F (37 C)-99.5 F (37.5 C)] 98.6 F (37 C) (08/28 0519) Pulse Rate:  [93-101] 97 (08/28 0519) Resp:  [18] 18 (08/28 0519) BP: (126-136)/(69-87) 126/87 mmHg (08/28 0519) SpO2:  [100 %] 100 % (08/28 0519)  Labs:  Recent Labs  08/30/12 0520 08/31/12 0516  HGB 11.8* 11.9*    Recent Labs  08/30/12 0520 08/31/12 0516  WBC 15.3* 9.5  RBC 3.91 3.93  HCT 35.0* 35.6*  PLT 256 246    Recent Labs  08/30/12 0520  NA 136  K 3.6  CL 102  CO2 26  BUN 7  CREATININE 0.66  GLUCOSE 96  CALCIUM 8.8   No results found for this basename: LABPT, INR,  in the last 72 hours  Physical Exam:  Neurologically intact ABD soft Neurovascular intact Sensation intact distally Intact pulses distally Dorsiflexion/Plantar flexion intact Incision: dressing C/D/I No cellulitis present Compartment soft  Assessment/Plan:  2 Days Post-Op Procedure(s) (LRB): RIGHT TOTAL KNEE ARTHROPLASTY (Right) Advance diet Up with therapy Discharge home with home health If she passes therapy today can be discharged this afternoon. ASA 325 one twice a day x2 weeks. Return to office in 2 weeks. Home health care for physical therapy    Joleene Burnham R 08/31/2012, 11:32 AM

## 2012-08-31 NOTE — Progress Notes (Signed)
Physical Therapy Treatment Patient Details Name: Dominique Blackwell MRN: [id number] DOB: 02/17/1965 Today's Date: 08/31/2012 Time: 4098-1191 PT Time Calculation (min): 25 min  PT Assessment / Plan / Recommendation  History of Present Illness Pt with R TKR   PT Comments   Patient progressing with ambulation and able to complete stair training. Patient is now planning to go home later this evening so all instructions reviewed.   Follow Up Recommendations  Home health PT     Does the patient have the potential to tolerate intense rehabilitation     Barriers to Discharge        Equipment Recommendations  None recommended by PT    Recommendations for Other Services    Frequency 7X/week   Progress towards PT Goals Progress towards PT goals: Progressing toward goals  Plan Current plan remains appropriate    Precautions / Restrictions Precautions Precautions: Knee Restrictions RLE Weight Bearing: Weight bearing as tolerated   Pertinent Vitals/Pain no apparent distress    Mobility  Bed Mobility Supine to Sit: 6: Modified independent (Device/Increase time) Sit to Supine: 5: Supervision Details for Bed Mobility Assistance: Patient able to hook LLE under RLE for back to bed Transfers Sit to Stand: 5: Supervision;From chair/3-in-1 Stand to Sit: 5: Supervision;To chair/3-in-1;To bed Details for Transfer Assistance: Cues for safe hand placement Ambulation/Gait Ambulation/Gait Assistance: 5: Supervision Ambulation Distance (Feet): 180 Feet Assistive device: Rolling walker Ambulation/Gait Assistance Details: Cues for posture, R heel strike and to increase weight through LEs Gait Pattern: Step-to pattern Gait velocity: decreased Stairs: Yes Stairs Assistance: 4: Min assist Stairs Assistance Details (indicate cue type and reason): A for balance.  Stair Management Technique: Two rails;Forwards Number of Stairs: 10    Exercises Total Joint Exercises Quad Sets: AROM;Right;10  reps Heel Slides: AAROM;Right;10 reps Hip ABduction/ADduction: Right;10 reps;AROM Straight Leg Raises: AAROM;Right;10 reps Long Arc Quad: AAROM;Right;10 reps   PT Diagnosis:    PT Problem List:   PT Treatment Interventions:     PT Goals (current goals can now be found in the care plan section)    Visit Information  Last PT Received On: 08/31/12 Assistance Needed: +1 History of Present Illness: Pt with R TKR    Subjective Data      Cognition  Cognition Arousal/Alertness: Awake/alert Behavior During Therapy: WFL for tasks assessed/performed Overall Cognitive Status: Within Functional Limits for tasks assessed    Balance     End of Session PT - End of Session Equipment Utilized During Treatment: Gait belt Activity Tolerance: Patient tolerated treatment well Patient left: in chair;with call bell/phone within reach;with family/visitor present   GP     Fredrich Birks 08/31/2012, 2:01 PM 08/31/2012 Fredrich Birks PTA [phone] pager [phone] office

## 2012-08-31 NOTE — Discharge Summary (Signed)
Patient ID: Dominique Blackwell MRN: [id number] DOB/AGE: 1965/10/25 47 y.o.  Admit date: 08/29/2012 Discharge date: 08/31/2012  Admission Diagnoses:  Principal Problem:   Right knee DJD   Discharge Diagnoses:  Same  Past Medical History  Diagnosis Date  . PONV (postoperative nausea and vomiting)   . Headache(784.0)     hx migraines  . Arthritis     Surgeries: Procedure(s): RIGHT TOTAL KNEE ARTHROPLASTY on 08/29/2012   Consultants:    Discharged Condition: Improved  Hospital Course: Dominique Blackwell is an 47 y.o. female who was admitted 08/29/2012 for operative treatment ofRight knee DJD. Patient has severe unremitting pain that affects sleep, daily activities, and work/hobbies. After pre-op clearance the patient was taken to the operating room on 08/29/2012 and underwent  Procedure(s): RIGHT TOTAL KNEE ARTHROPLASTY.    Patient was given perioperative antibiotics: Anti-infectives   Start     Dose/Rate Route Frequency Ordered Stop   08/29/12 1500  ceFAZolin (ANCEF) IVPB 2 g/50 mL premix  Status:  Discontinued     2 g 100 mL/hr over 30 Minutes Intravenous 4 times per day 08/29/12 1307 08/29/12 1310   08/29/12 1500  ceFAZolin (ANCEF) IVPB 2 g/50 mL premix     2 g 100 mL/hr over 30 Minutes Intravenous Every 6 hours 08/29/12 1310 08/29/12 2136   08/29/12 0600  ceFAZolin (ANCEF) IVPB 2 g/50 mL premix     2 g 100 mL/hr over 30 Minutes Intravenous On call to O.R. 08/28/12 1433 08/29/12 0750       Patient was given sequential compression devices, early ambulation, and chemoprophylaxis to prevent DVT.  Patient benefited maximally from hospital stay and there were no complications.    Recent vital signs: Patient Vitals for the past 24 hrs:  BP Temp Temp src Pulse Resp SpO2  08/31/12 0519 126/87 mmHg 98.6 F (37 C) Oral 97 18 100 %  08/30/12 2023 136/79 mmHg 99.5 F (37.5 C) Oral 101 18 100 %  08/30/12 1411 136/69 mmHg 98.9 F (37.2 C) - 93 18 100 %     Recent laboratory  studies:  Recent Labs  08/30/12 0520 08/31/12 0516  WBC 15.3* 9.5  HGB 11.8* 11.9*  HCT 35.0* 35.6*  PLT 256 246  NA 136  --   K 3.6  --   CL 102  --   CO2 26  --   BUN 7  --   CREATININE 0.66  --   GLUCOSE 96  --   CALCIUM 8.8  --      Discharge Medications:     Medication List    STOP taking these medications       acetaminophen 500 MG tablet  Commonly known as:  TYLENOL     ibuprofen 200 MG tablet  Commonly known as:  ADVIL,MOTRIN     multivitamin with minerals Tabs tablet     naproxen sodium 220 MG tablet  Commonly known as:  ANAPROX      TAKE these medications       aspirin 325 MG EC tablet  Take 1 tablet (325 mg total) by mouth 2 (two) times daily.     methocarbamol 500 MG tablet  Commonly known as:  ROBAXIN  Take 1 tablet (500 mg total) by mouth every 6 (six) hours as needed.     oxyCODONE 5 MG immediate release tablet  Commonly known as:  Oxy IR/ROXICODONE  Take 1-2 tablets (5-10 mg total) by mouth every 3 (three) hours as needed.  Diagnostic Studies: Dg Chest 2 View  08/21/2012   *RADIOLOGY REPORT*  Clinical Data: 47 year old female preoperative study for right knee surgery.  Headache, arthritis.  CHEST - 2 VIEW  Comparison: None.  Findings: Lung volumes are within normal limits. Normal cardiac size and mediastinal contours.  Visualized tracheal air column is within normal limits.  The lungs are clear.  No pneumothorax or effusion.  Postoperative changes to the ventral lower chest/upper abdomen and left upper quadrant. No acute osseous abnormality identified.  IMPRESSION: Negative, no acute cardiopulmonary abnormality.   Original Report Authenticated By: Erskine Speed, M.D.    Disposition: Final discharge disposition not confirmed      Discharge Orders   Future Orders Complete By Expires   Call MD / Call 911  As directed    Comments:     If you experience chest pain or shortness of breath, CALL 911 and be transported to the hospital  emergency room.  If you develope a fever above 101 F, pus (white drainage) or increased drainage or redness at the wound, or calf pain, call your surgeon's office.   Constipation Prevention  As directed    Comments:     Drink plenty of fluids.  Prune juice may be helpful.  You may use a stool softener, such as Colace (over the counter) 100 mg twice a day.  Use MiraLax (over the counter) for constipation as needed.   Diet - low sodium heart healthy  As directed    Increase activity slowly as tolerated  As directed       Follow-up Information   Follow up with Velna Ochs, MD. Schedule an appointment as soon as possible for a visit in 2 weeks.   Specialty:  Orthopedic Surgery   Contact information:   [address] [place] [address] [phone]       Follow up with [facility].   Contact information:   [address] [place] [address] [phone]        Signed: Prince Rome 08/31/2012, 11:36 AM

## 2012-09-27 NOTE — Progress Notes (Signed)
Late Entry: SW received a consult for possible placement. PT  At this time is recommending home with [facility] and not SNF.  Clinical Social Worker will sign off for now as social work intervention is no longer needed. Please consult [place] again if new need arises.   Sabino Niemann, MSW [phone]

## 2012-11-09 ENCOUNTER — Other Ambulatory Visit: Payer: Self-pay

## 2013-10-19 ENCOUNTER — Other Ambulatory Visit: Payer: Self-pay

## 2014-08-09 ENCOUNTER — Other Ambulatory Visit: Payer: Self-pay | Admitting: Orthopaedic Surgery

## 2014-08-19 ENCOUNTER — Encounter (HOSPITAL_BASED_OUTPATIENT_CLINIC_OR_DEPARTMENT_OTHER): Admission: RE | Payer: Self-pay | Source: Ambulatory Visit

## 2014-08-19 ENCOUNTER — Ambulatory Visit (HOSPITAL_BASED_OUTPATIENT_CLINIC_OR_DEPARTMENT_OTHER): Admission: RE | Admit: 2014-08-19 | Payer: 59 | Source: Ambulatory Visit | Admitting: Orthopaedic Surgery

## 2014-08-19 SURGERY — OSTEOTOMY, METATARSAL BONE
Anesthesia: Monitor Anesthesia Care | Site: Foot | Laterality: Left

## 2014-09-11 ENCOUNTER — Other Ambulatory Visit: Payer: Self-pay | Admitting: Orthopedic Surgery

## 2014-11-01 ENCOUNTER — Encounter (HOSPITAL_BASED_OUTPATIENT_CLINIC_OR_DEPARTMENT_OTHER): Payer: Self-pay | Admitting: *Deleted

## 2014-11-07 ENCOUNTER — Encounter (HOSPITAL_BASED_OUTPATIENT_CLINIC_OR_DEPARTMENT_OTHER)
Admission: RE | Disposition: A | Discharge status: Home / Self Care | Payer: Self-pay | Source: Ambulatory Visit | Attending: Orthopedic Surgery

## 2014-11-07 ENCOUNTER — Ambulatory Visit (HOSPITAL_BASED_OUTPATIENT_CLINIC_OR_DEPARTMENT_OTHER): Payer: 59 | Admitting: Certified Registered"

## 2014-11-07 ENCOUNTER — Ambulatory Visit (HOSPITAL_BASED_OUTPATIENT_CLINIC_OR_DEPARTMENT_OTHER)
Admission: RE | Admit: 2014-11-07 | Discharge: 2014-11-07 | Disposition: A | Discharge status: Home / Self Care | Payer: 59 | Source: Ambulatory Visit | Attending: Orthopedic Surgery | Admitting: Orthopedic Surgery

## 2014-11-07 ENCOUNTER — Encounter (HOSPITAL_BASED_OUTPATIENT_CLINIC_OR_DEPARTMENT_OTHER): Payer: Self-pay | Admitting: Certified Registered"

## 2014-11-07 DIAGNOSIS — M19072 Primary osteoarthritis, left ankle and foot: Secondary | ICD-10-CM | POA: Diagnosis not present

## 2014-11-07 HISTORY — PX: ARTHRODESIS METATARSAL: SHX6565

## 2014-11-07 SURGERY — FUSION, JOINT, INVOLVING METATARSAL BONE
Anesthesia: Regional | Site: Foot | Laterality: Left

## 2014-11-07 MED ORDER — BUPIVACAINE-EPINEPHRINE (PF) 0.5% -1:200000 IJ SOLN
INTRAMUSCULAR | Status: DC | PRN
  Administered 2014-11-07: 10 mL
  Administered 2014-11-07: 10 mL via PERINEURAL

## 2014-11-07 MED ORDER — ONDANSETRON HCL 4 MG/2ML IJ SOLN
INTRAMUSCULAR | Status: AC
  Filled 2014-11-07: qty 2

## 2014-11-07 MED ORDER — CHLORHEXIDINE GLUCONATE 4 % EX LIQD: 60.0000 mL | Freq: Once | CUTANEOUS | Status: DC

## 2014-11-07 MED ORDER — OXYCODONE HCL 5 MG/5ML PO SOLN: 5.0000 mg | Freq: Once | ORAL | Status: DC | PRN

## 2014-11-07 MED ORDER — HYDROMORPHONE HCL 1 MG/ML IJ SOLN
INTRAMUSCULAR | Status: AC
  Filled 2014-11-07: qty 1

## 2014-11-07 MED ORDER — FENTANYL CITRATE (PF) 100 MCG/2ML IJ SOLN
50.0000 ug | INTRAMUSCULAR | Status: DC | PRN
Start: 2014-11-07 — End: 2014-11-07
  Administered 2014-11-07: 100 ug via INTRAVENOUS

## 2014-11-07 MED ORDER — SODIUM CHLORIDE 0.9 % IV SOLN: INTRAVENOUS | Status: DC

## 2014-11-07 MED ORDER — LACTATED RINGERS IV SOLN
INTRAVENOUS | Status: DC
  Administered 2014-11-07 (×2): via INTRAVENOUS

## 2014-11-07 MED ORDER — MIDAZOLAM HCL 2 MG/2ML IJ SOLN
INTRAMUSCULAR | Status: AC
  Filled 2014-11-07: qty 4

## 2014-11-07 MED ORDER — LACTATED RINGERS IV SOLN: INTRAVENOUS | Status: DC

## 2014-11-07 MED ORDER — SCOPOLAMINE 1 MG/3DAYS TD PT72
1.0000 | MEDICATED_PATCH | Freq: Once | TRANSDERMAL | Status: DC | PRN
  Administered 2014-11-07: 1.5 mg via TRANSDERMAL

## 2014-11-07 MED ORDER — MIDAZOLAM HCL 2 MG/2ML IJ SOLN
1.0000 mg | INTRAMUSCULAR | Status: DC | PRN
  Administered 2014-11-07: 1 mg via INTRAVENOUS
  Administered 2014-11-07: 2 mg via INTRAVENOUS

## 2014-11-07 MED ORDER — OXYCODONE HCL 5 MG PO TABS: 5.0000 mg | ORAL_TABLET | Freq: Once | ORAL | Status: DC | PRN

## 2014-11-07 MED ORDER — CEFAZOLIN SODIUM-DEXTROSE 2-3 GM-% IV SOLR
2.0000 g | INTRAVENOUS | Status: AC
  Administered 2014-11-07: 2 g via INTRAVENOUS

## 2014-11-07 MED ORDER — PROPOFOL 500 MG/50ML IV EMUL
INTRAVENOUS | Status: AC
  Filled 2014-11-07: qty 50

## 2014-11-07 MED ORDER — CEFAZOLIN SODIUM-DEXTROSE 2-3 GM-% IV SOLR
INTRAVENOUS | Status: AC
  Filled 2014-11-07: qty 50

## 2014-11-07 MED ORDER — CEFAZOLIN SODIUM-DEXTROSE 2-3 GM-% IV SOLR: 2.0000 g | INTRAVENOUS | Status: DC

## 2014-11-07 MED ORDER — SENNA 8.6 MG PO TABS: 2.0000 | ORAL_TABLET | Freq: Two times a day (BID) | ORAL | Status: AC

## 2014-11-07 MED ORDER — MEPERIDINE HCL 25 MG/ML IJ SOLN: 6.2500 mg | INTRAMUSCULAR | Status: DC | PRN

## 2014-11-07 MED ORDER — 0.9 % SODIUM CHLORIDE (POUR BTL) OPTIME
TOPICAL | Status: DC | PRN
  Administered 2014-11-07: 200 mL

## 2014-11-07 MED ORDER — GLYCOPYRROLATE 0.2 MG/ML IJ SOLN: 0.2000 mg | Freq: Once | INTRAMUSCULAR | Status: DC | PRN

## 2014-11-07 MED ORDER — ASPIRIN EC 325 MG PO TBEC: 325.0000 mg | DELAYED_RELEASE_TABLET | Freq: Every day | ORAL | Status: AC

## 2014-11-07 MED ORDER — LIDOCAINE HCL (CARDIAC) 20 MG/ML IV SOLN
INTRAVENOUS | Status: DC | PRN
  Administered 2014-11-07: 30 mg via INTRAVENOUS

## 2014-11-07 MED ORDER — DOCUSATE SODIUM 100 MG PO CAPS: 100.0000 mg | ORAL_CAPSULE | Freq: Two times a day (BID) | ORAL | Status: AC

## 2014-11-07 MED ORDER — DEXAMETHASONE SODIUM PHOSPHATE 10 MG/ML IJ SOLN
INTRAMUSCULAR | Status: DC | PRN
  Administered 2014-11-07: 10 mg via INTRAVENOUS

## 2014-11-07 MED ORDER — FENTANYL CITRATE (PF) 100 MCG/2ML IJ SOLN
INTRAMUSCULAR | Status: AC
  Filled 2014-11-07: qty 2

## 2014-11-07 MED ORDER — OXYCODONE HCL 5 MG PO TABS
5.0000 mg | ORAL_TABLET | ORAL | Status: AC | PRN
Start: 2014-11-07 — End: ?

## 2014-11-07 MED ORDER — ONDANSETRON HCL 4 MG/2ML IJ SOLN
INTRAMUSCULAR | Status: DC | PRN
  Administered 2014-11-07 (×2): 4 mg via INTRAVENOUS

## 2014-11-07 MED ORDER — LIDOCAINE HCL (CARDIAC) 20 MG/ML IV SOLN
INTRAVENOUS | Status: AC
  Filled 2014-11-07: qty 5

## 2014-11-07 MED ORDER — PROPOFOL 10 MG/ML IV BOLUS
INTRAVENOUS | Status: DC | PRN
  Administered 2014-11-07: 150 mg via INTRAVENOUS

## 2014-11-07 MED ORDER — ROPIVACAINE HCL 5 MG/ML IJ SOLN
INTRAMUSCULAR | Status: DC | PRN
  Administered 2014-11-07 (×2): 10 mL via PERINEURAL

## 2014-11-07 MED ORDER — HYDROMORPHONE HCL 1 MG/ML IJ SOLN
0.2500 mg | INTRAMUSCULAR | Status: DC | PRN
  Administered 2014-11-07 (×3): 0.5 mg via INTRAVENOUS

## 2014-11-07 MED ORDER — DEXAMETHASONE SODIUM PHOSPHATE 10 MG/ML IJ SOLN
INTRAMUSCULAR | Status: AC
  Filled 2014-11-07: qty 1

## 2014-11-07 MED ORDER — MIDAZOLAM HCL 2 MG/2ML IJ SOLN
INTRAMUSCULAR | Status: AC
  Filled 2014-11-07: qty 2

## 2014-11-07 SURGICAL SUPPLY — 74 items
BANDAGE ESMARK 6X9 LF (GAUZE/BANDAGES/DRESSINGS) ×1 IMPLANT
BIT DRILL 2.5X2.75 QC CALB (BIT) ×2 IMPLANT
BIT DRILL 2.9 CANN QC NONSTRL (BIT) ×2 IMPLANT
BLADE AVERAGE 25MMX9MM (BLADE)
BLADE AVERAGE 25X9 (BLADE) IMPLANT
BLADE MICRO SAGITTAL (BLADE) ×2 IMPLANT
BLADE OSC/SAG .038X5.5 CUT EDG (BLADE) IMPLANT
BLADE SURG 15 STRL LF DISP TIS (BLADE) ×2 IMPLANT
BLADE SURG 15 STRL SS (BLADE) ×9
BNDG CMPR 9X6 STRL LF SNTH (GAUZE/BANDAGES/DRESSINGS) ×1
BNDG COHESIVE 4X5 TAN STRL (GAUZE/BANDAGES/DRESSINGS) ×3 IMPLANT
BNDG COHESIVE 6X5 TAN STRL LF (GAUZE/BANDAGES/DRESSINGS) ×3 IMPLANT
BNDG ESMARK 6X9 LF (GAUZE/BANDAGES/DRESSINGS) ×3
BNDG GAUZE ELAST 4 BULKY (GAUZE/BANDAGES/DRESSINGS) IMPLANT
BUR EGG 3PK/BX (BURR) IMPLANT
CHLORAPREP W/TINT 26ML (MISCELLANEOUS) ×3 IMPLANT
CLOSURE WOUND 1/2 X4 (GAUZE/BANDAGES/DRESSINGS)
COVER BACK TABLE 60X90IN (DRAPES) ×3 IMPLANT
CUFF TOURNIQUET SINGLE 34IN LL (TOURNIQUET CUFF) ×3 IMPLANT
DECANTER SPIKE VIAL GLASS SM (MISCELLANEOUS) IMPLANT
DRAPE C-ARM 42X72 X-RAY (DRAPES) IMPLANT
DRAPE C-ARMOR (DRAPES) IMPLANT
DRAPE EXTREMITY T 121X128X90 (DRAPE) ×3 IMPLANT
DRAPE OEC MINIVIEW 54X84 (DRAPES) ×3 IMPLANT
DRAPE U-SHAPE 47X51 STRL (DRAPES) ×3 IMPLANT
DRSG MEPITEL 4X7.2 (GAUZE/BANDAGES/DRESSINGS) ×3 IMPLANT
DRSG PAD ABDOMINAL 8X10 ST (GAUZE/BANDAGES/DRESSINGS) ×6 IMPLANT
ELECT REM PT RETURN 9FT ADLT (ELECTROSURGICAL) ×3
ELECTRODE REM PT RTRN 9FT ADLT (ELECTROSURGICAL) ×1 IMPLANT
GAUZE SPONGE 4X4 12PLY STRL (GAUZE/BANDAGES/DRESSINGS) ×3 IMPLANT
GLOVE BIO SURGEON STRL SZ8 (GLOVE) ×3 IMPLANT
GLOVE BIOGEL PI IND STRL 7.0 (GLOVE) IMPLANT
GLOVE BIOGEL PI IND STRL 8 (GLOVE) ×2 IMPLANT
GLOVE BIOGEL PI INDICATOR 7.0 (GLOVE) ×2
GLOVE BIOGEL PI INDICATOR 8 (GLOVE) ×4
GLOVE ECLIPSE 6.5 STRL STRAW (GLOVE) ×2 IMPLANT
GLOVE ECLIPSE 7.5 STRL STRAW (GLOVE) ×3 IMPLANT
GLOVE EXAM NITRILE MD LF STRL (GLOVE) ×2 IMPLANT
GOWN STRL REUS W/ TWL LRG LVL3 (GOWN DISPOSABLE) ×1 IMPLANT
GOWN STRL REUS W/ TWL XL LVL3 (GOWN DISPOSABLE) ×2 IMPLANT
GOWN STRL REUS W/TWL LRG LVL3 (GOWN DISPOSABLE) ×3
GOWN STRL REUS W/TWL XL LVL3 (GOWN DISPOSABLE) ×6
K-WIRE ACE 1.6X6 (WIRE) ×3
KWIRE ACE 1.6X6 (WIRE) IMPLANT
NEEDLE HYPO 22GX1.5 SAFETY (NEEDLE) IMPLANT
PACK BASIN DAY SURGERY FS (CUSTOM PROCEDURE TRAY) ×3 IMPLANT
PAD CAST 4YDX4 CTTN HI CHSV (CAST SUPPLIES) ×1 IMPLANT
PADDING CAST ABS 4INX4YD NS (CAST SUPPLIES)
PADDING CAST ABS COTTON 4X4 ST (CAST SUPPLIES) IMPLANT
PADDING CAST COTTON 4X4 STRL (CAST SUPPLIES) ×3
PADDING CAST COTTON 6X4 STRL (CAST SUPPLIES) ×3 IMPLANT
PENCIL BUTTON HOLSTER BLD 10FT (ELECTRODE) ×3 IMPLANT
SANITIZER HAND PURELL 535ML FO (MISCELLANEOUS) ×3 IMPLANT
SCREW ACE CAN 4.0 42M (Screw) ×2 IMPLANT
SCREW LOW PROFILE 3.5MMX42 (Screw) ×2 IMPLANT
SHEET MEDIUM DRAPE 40X70 STRL (DRAPES) ×3 IMPLANT
SLEEVE SCD COMPRESS KNEE MED (MISCELLANEOUS) ×3 IMPLANT
SPLINT FAST PLASTER 5X30 (CAST SUPPLIES) ×40
SPLINT PLASTER CAST FAST 5X30 (CAST SUPPLIES) ×20 IMPLANT
SPONGE LAP 18X18 X RAY DECT (DISPOSABLE) ×3 IMPLANT
STOCKINETTE 6  STRL (DRAPES) ×2
STOCKINETTE 6 STRL (DRAPES) ×1 IMPLANT
STRIP CLOSURE SKIN 1/2X4 (GAUZE/BANDAGES/DRESSINGS) IMPLANT
SUCTION FRAZIER TIP 10 FR DISP (SUCTIONS) ×3 IMPLANT
SUT ETHILON 3 0 PS 1 (SUTURE) ×5 IMPLANT
SUT MNCRL AB 3-0 PS2 18 (SUTURE) ×3 IMPLANT
SUT VIC AB 0 SH 27 (SUTURE) IMPLANT
SUT VIC AB 2-0 SH 27 (SUTURE) ×3
SUT VIC AB 2-0 SH 27XBRD (SUTURE) IMPLANT
SYR BULB 3OZ (MISCELLANEOUS) ×3 IMPLANT
TOWEL OR 17X24 6PK STRL BLUE (TOWEL DISPOSABLE) ×6 IMPLANT
TUBE CONNECTING 20'X1/4 (TUBING) ×1
TUBE CONNECTING 20X1/4 (TUBING) ×2 IMPLANT
UNDERPAD 30X30 (UNDERPADS AND DIAPERS) ×3 IMPLANT

## 2014-11-07 NOTE — Transfer of Care (Signed)
Immediate Anesthesia Transfer of Care Note  Patient: Dominique Blackwell  Procedure(s) Performed: Procedure(s): LEFT FIRST TARSAL METATARSAL ARTHRODESIS  (Left)  Patient Location: PACU  Anesthesia Type:GA combined with regional for post-op pain  Level of Consciousness: awake, alert , oriented and patient cooperative  Airway & Oxygen Therapy: Patient Spontanous Breathing and Patient connected to face mask oxygen  Post-op Assessment: Report given to RN and Post -op Vital signs reviewed and stable  Post vital signs: Reviewed and stable  Last Vitals:  Filed Vitals:   11/07/14 1134  BP:   Pulse: 94  Temp:   Resp: 17    Complications: No apparent anesthesia complications

## 2014-11-07 NOTE — Anesthesia Preprocedure Evaluation (Addendum)
Anesthesia Evaluation  Patient identified by MRN, date of birth, ID band Patient awake    Reviewed: Allergy & Precautions, NPO status , Patient's Chart, lab work & pertinent test results  History of Anesthesia Complications (+) PONV  Airway Mallampati: I  TM Distance: >3 FB Neck ROM: Full    Dental  (+) Teeth Intact, Dental Advisory Given   Pulmonary    breath sounds clear to auscultation       Cardiovascular  Rhythm:Regular Rate:Normal     Neuro/Psych  Headaches,    GI/Hepatic   Endo/Other    Renal/GU      Musculoskeletal  (+) Arthritis ,   Abdominal   Peds  Hematology   Anesthesia Other Findings   Reproductive/Obstetrics                            Anesthesia Physical Anesthesia Plan  ASA: I  Anesthesia Plan: General and Regional   Post-op Pain Management:    Induction: Intravenous  Airway Management Planned: LMA  Additional Equipment:   Intra-op Plan:   Post-operative Plan: Extubation in OR  Informed Consent: I have reviewed the patients History and Physical, chart, labs and discussed the procedure including the risks, benefits and alternatives for the proposed anesthesia with the patient or authorized representative who has indicated his/her understanding and acceptance.   Dental advisory given  Plan Discussed with: CRNA, Anesthesiologist and Surgeon  Anesthesia Plan Comments:         Anesthesia Quick Evaluation

## 2014-11-07 NOTE — Anesthesia Postprocedure Evaluation (Signed)
  Anesthesia Post-op Note  Patient: Dominique Blackwell  Procedure(s) Performed: Procedure(s): LEFT FIRST TARSAL METATARSAL ARTHRODESIS  (Left)  Patient Location: PACU  Anesthesia Type: General, Regional   Level of Consciousness: awake, alert  and oriented  Airway and Oxygen Therapy: Patient Spontanous Breathing  Post-op Pain: mild  Post-op Assessment: Post-op Vital signs reviewed  Post-op Vital Signs: Reviewed  Last Vitals:  Filed Vitals:   11/07/14 1325  BP: 118/74  Pulse: 79  Temp: 36.8 C  Resp: 18    Complications: No apparent anesthesia complications

## 2014-11-07 NOTE — Progress Notes (Signed)
Assisted Dr. Crews with left, ultrasound guided, popliteal/saphenous block. Side rails up, monitors on throughout procedure. See vital signs in flow sheet. Tolerated Procedure well. 

## 2014-11-07 NOTE — Discharge Instructions (Addendum)
Dominique Hewitt, MD °[facility] ° °Please read the following information regarding your care after surgery. ° °Medications  °You only need a prescription for the narcotic pain medicine (ex. oxycodone, Percocet, Norco).  All of the other medicines listed below are available over the counter. °X acetominophen (Tylenol) 650 mg every 4-6 hours as you need for minor pain °X oxycodone as prescribed for moderate to severe pain °?  ° °Narcotic pain medicine (ex. oxycodone, Percocet, Vicodin) will cause constipation.  To prevent this problem, take the following medicines while you are taking any pain medicine. °X docusate sodium (Colace) 100 mg twice a day X senna (Senokot) 2 tablets twice a day ° °X To help prevent blood clots, take an aspirin (325 mg) once a day for a month after surgery.  You should also get up every hour while you are awake to move around.   ° °Weight Bearing °X Do not bear any weight on the operated leg or foot. ° °Cast / Splint / Dressing °X Keep your splint or cast clean and dry.  Don’t put anything (coat hanger, pencil, etc) down inside of it.  If it gets damp, use a hair dryer on the cool setting to dry it.  If it gets soaked, call the office to schedule an appointment for a cast change. ° ° °After your dressing, cast or splint is removed; you may shower, but do not soak or scrub the wound.  Allow the water to run over it, and then gently pat it dry. ° °Swelling °It is normal for you to have swelling where you had surgery.  To reduce swelling and pain, keep your toes above your nose for at least 3 days after surgery.  It may be necessary to keep your foot or leg elevated for several weeks.  If it hurts, it should be elevated. ° °Follow Up °Call my office at [phone] when you are discharged from the hospital or surgery center to schedule an appointment to be seen two weeks after surgery. ° °Call my office at [phone] if you develop a fever >101.5° F, nausea, vomiting, bleeding from  the surgical site or severe pain.   ° °Regional Anesthesia Blocks ° °1. Numbness or the inability to move the "blocked" extremity may last from 3-48 hours after placement. The length of time depends on the medication injected and your individual response to the medication. If the numbness is not going away after 48 hours, call your surgeon. ° °2. The extremity that is blocked will need to be protected until the numbness is gone and the  Strength has returned. Because you cannot feel it, you will need to take extra care to avoid injury. Because it may be weak, you may have difficulty moving it or using it. You may not know what position it is in without looking at it while the block is in effect. ° °3. For blocks in the legs and feet, returning to weight bearing and walking needs to be done carefully. You will need to wait until the numbness is entirely gone and the strength has returned. You should be able to move your leg and foot normally before you try and bear weight or walk. You will need someone to be with you when you first try to ensure you do not fall and possibly risk injury. ° °4. Bruising and tenderness at the needle site are common side effects and will resolve in a few days. ° °5. Persistent numbness or new problems with movement   should be communicated to the surgeon or the [facility] ([phone])/ [facility] ([phone]). ° °Post Anesthesia Home Care Instructions ° °Activity: °Get plenty of rest for the remainder of the day. A responsible adult should stay with you for 24 hours following the procedure.  °For the next 24 hours, DO NOT: °-Drive a car °-Operate machinery °-Drink alcoholic beverages °-Take any medication unless instructed by your physician °-Make any legal decisions or sign important papers. ° °Meals: °Start with liquid foods such as gelatin or soup. Progress to regular foods as tolerated. Avoid greasy, spicy, heavy foods. If nausea and/or vomiting occur,  drink only clear liquids until the nausea and/or vomiting subsides. Call your physician if vomiting continues. ° °Special Instructions/Symptoms: °Your throat may feel dry or sore from the anesthesia or the breathing tube placed in your throat during surgery. If this causes discomfort, gargle with warm salt water. The discomfort should disappear within 24 hours. ° °If you had a scopolamine patch placed behind your ear for the management of post- operative nausea and/or vomiting: ° °1. The medication in the patch is effective for 72 hours, after which it should be removed.  Wrap patch in a tissue and discard in the trash. Wash hands thoroughly with soap and water. °2. You may remove the patch earlier than 72 hours if you experience unpleasant side effects which may include dry mouth, dizziness or visual disturbances. °3. Avoid touching the patch. Wash your hands with soap and water after contact with the patch. °  ° °

## 2014-11-07 NOTE — Anesthesia Procedure Notes (Addendum)
Anesthesia Regional Block:  Popliteal block  Pre-Anesthetic Checklist: ,, timeout performed, Correct Patient, Correct Site, Correct Laterality, Correct Procedure, Correct Position, site marked, Risks and benefits discussed,  Surgical consent,  Pre-op evaluation,  At surgeon's request and post-op pain management  Laterality: Left and Lower  Prep: chloraprep       Needles:  Injection technique: Single-shot  Needle Type: Echogenic Needle     Needle Length: 9cm 9 cm Needle Gauge: 21 and 21 G    Additional Needles:  Procedures: ultrasound guided (picture in chart) Popliteal block Narrative:  Start time: 11/07/2014 9:20 AM End time: 11/07/2014 9:23 AM Injection made incrementally with aspirations every 5 mL.  Performed by: Personally  Anesthesiologist: CREWS, DAVID   Anesthesia Regional Block:  Adductor canal block  Pre-Anesthetic Checklist: ,, timeout performed, Correct Patient, Correct Site, Correct Laterality, Correct Procedure, Correct Position, site marked, Risks and benefits discussed,  Surgical consent,  Pre-op evaluation,  At surgeon's request and post-op pain management  Laterality: Left and Lower  Prep: chloraprep       Needles:  Injection technique: Single-shot  Needle Type: Echogenic Needle     Needle Length: 9cm 9 cm Needle Gauge: 21 and 21 G    Additional Needles:  Procedures: ultrasound guided (picture in chart) Adductor canal block Narrative:  Start time: 11/07/2014 9:24 AM End time: 11/07/2014 9:28 AM Injection made incrementally with aspirations every 5 mL.  Performed by: Personally  Anesthesiologist: CREWS, DAVID   Procedure Name: LMA Insertion Date/Time: 11/07/2014 10:15 AM Performed by: Sheilah Rayos D Pre-anesthesia Checklist: Patient identified, Emergency Drugs available, Suction available and Patient being monitored Patient Re-evaluated:Patient Re-evaluated prior to inductionOxygen Delivery Method: Circle System  Utilized Preoxygenation: Pre-oxygenation with 100% oxygen Intubation Type: IV induction Ventilation: Mask ventilation without difficulty LMA: LMA inserted LMA Size: 4.0 Number of attempts: 1 Airway Equipment and Method: Bite block Placement Confirmation: positive ETCO2 Tube secured with: Tape Dental Injury: Teeth and Oropharynx as per pre-operative assessment         Upper: Left popliteal block image;  Lower: Left saphenous block image

## 2014-11-07 NOTE — H&P (Signed)
Dominique Blackwell is an 49 y.o. female.   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  She presents now for arthrodesis having failed non op treatment.  Past Medical History  Diagnosis Date  . PONV (postoperative nausea and vomiting)   . Headache(784.0)     hx migraines  . Arthritis     rt ankle, lt foot    Past Surgical History  Procedure Laterality Date  . Thrumb Left 14    trigger  . Knee arthroscopy Right 03,04    infection 04  . Ablation      uterine  . Tubal ligation  89  . Lipoma excision Left     leg  . 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 Total knee arthroplasty Right 08/29/2012    Dr Yisroel Rammingaldorf  . Total knee arthroplasty Right 08/29/2012    Procedure: RIGHT TOTAL KNEE ARTHROPLASTY;  Surgeon: Velna OchsPeter G Dalldorf, MD;  Location: [facility] OR;  Service: Orthopedics;  Laterality: Right;  . Joint replacement Right 2014    History reviewed. No pertinent family history. Social History:  reports that she has never smoked. She has never used smokeless tobacco. She reports that she drinks alcohol. She reports that she does not use illicit drugs.  Allergies: No Known Allergies  Medications Prior to Admission  Medication Sig Dispense Refill  . ibuprofen (ADVIL,MOTRIN) 200 MG tablet Take 200 mg by mouth every 6 (six) hours as needed.      No results found for this or any previous visit (from the past 48 hour(s)). No results found.  ROS  No recent f/c/n/v/wt loss  Blood pressure 117/63, pulse 88, temperature 98 F (36.7 C), temperature source Oral, resp. rate 13, height 5\' 6"  (1.676 m), weight 70.308 kg (155 lb), SpO2 100 %. Physical Exam  wn wd female in nad.  A and O x 4.  Mood and affect normal.  EOMI.  resp unlabored.  L foot with healthy and intact skin.  No lymphadenopathy.  5/5 strength in PF and DF of the ankle and toes.  Sens to LT intact.  TTP at 1st TMT joint with bony prominence  dorsally.  Assessment/Plan L 1st TMT arthritis - to OR for L 1st TMT arthrodesis.  The risks and benefits of the alternative treatment options have been discussed in detail.  The patient wishes to proceed with surgery and specifically understands risks of bleeding, infection, nerve damage, blood clots, need for additional surgery, amputation and death.   Dominique Blackwell, Dominique Blackwell 11/07/2014, 10:03 AM

## 2014-11-07 NOTE — Brief Op Note (Signed)
11/07/2014  11:32 AM  PATIENT:  Dominique Blackwell  49 y.o. female  PRE-OPERATIVE DIAGNOSIS:  LEFT FIRST TARSAL METATARSAL ARTHRITIS  POST-OPERATIVE DIAGNOSIS:  LEFT FIRST TARSAL METATARSAL ARTHRITIS  Procedure(s): 1.  LEFT FIRST TARSAL METATARSAL ARTHRODESIS  2.  Left foot AP and lateral xrays  SURGEON:  Toni ArthursJohn Maryetta Shafer, MD  ASSISTANT: Alfredo MartinezJustin Ollis, PA-C  ANESTHESIA:   General, regional  EBL:  minimal   TOURNIQUET:  41 min at 250 mm Hg  COMPLICATIONS:  None apparent  DISPOSITION:  Extubated, awake and stable to recovery.  DICTATION ID:  [id number]

## 2014-11-08 ENCOUNTER — Encounter (HOSPITAL_BASED_OUTPATIENT_CLINIC_OR_DEPARTMENT_OTHER): Payer: Self-pay | Admitting: Orthopedic Surgery

## 2014-11-08 NOTE — Op Note (Signed)
NAMLinus Salmons:  Dominique Blackwell, Dominique Blackwell            ACCOUNT NO.:  [id number]  MEDICAL RECORD NO.:  [id number]  LOCATION:                                 FACILITY:  PHYSICIAN:  Toni ArthursJohn Dorota Heinrichs, MD             DATE OF BIRTH:  DATE OF PROCEDURE:  11/07/2014 DATE OF DISCHARGE:                              OPERATIVE REPORT   PREOPERATIVE DIAGNOSIS:  Left first tarsometatarsal joint arthritis.  POSTOPERATIVE DIAGNOSIS:  Left first tarsometatarsal joint arthritis.  PROCEDURE: 1. Left first tarsometatarsal joint arthrodesis. 2. Left foot AP and lateral radiographs.  SURGEON:  Toni ArthursJohn Oneil Behney, MD.  ASSISTANT:  Alfredo MartinezJustin Ollis, PA-C.  ANESTHESIA:  General, regional.  ESTIMATED BLOOD LOSS:  Minimal.  TOURNIQUET TIME:  41 minutes at 250 mmHg.  COMPLICATIONS:  None apparent.  DISPOSITION:  Extubated, awake, and stable to recovery.  INDICATIONS FOR PROCEDURE:  The patient is a 49 year old woman with a long history of midfoot pain.  Radiographs reveal first TMT joint arthritis.  She presents now for arthrodesis of the first TMT joint. She understands the risks and benefits, the alternative treatment options, and elects surgical treatment.  She specifically understands risks of bleeding, infection, nerve damage, blood clots, need for additional surgery, continued pain, nonunion, amputation, and death.  PROCEDURE IN DETAIL:  After preoperative consent was obtained and the correct operative site was identified, the patient was brought to the operating room and placed supine on the operating table.  General anesthesia was induced.  Preoperative antibiotics were administered. Surgical time-out was taken.  Left lower extremity was prepped and draped in standard sterile fashion with tourniquet around the thigh. The extremity was exsanguinated and tourniquet was inflated to 250 mmHg. A longitudinal incision was made over the first TMT joint.  Sharp dissection was carried down through skin and subcutaneous tissue.   The joint capsule was incised in line with the first ray and elevated medially and laterally.  The osteophytes were removed from the dorsum of the foot with a rongeur.  The joint was opened with a lamina spreader. The remaining articular cartilage was removed from both sides of the joint.  The wound was then irrigated copiously.  The joint was then perforated on both sides with a 2.5-mm drill bit leaving the resultant bone graft in place.  Joint was reduced.  K-wire was inserted from the medial cuneiform to the first metatarsal from dorsal to plantar.  AP and lateral radiographs showed appropriate positioning of the K-wire as well as appropriate alignment of the joint.  A 4-mm partially-threaded cannulated screw was inserted and was tightened compressing the first TMT joint appropriately.  A K-wire was then placed from the dorsal aspect of the first metatarsal base across to the plantar aspect of the medial cuneiform.  AP and lateral radiographs confirmed appropriate position of the K-wire.  K-wire was over drilled and a 3.5 mm fully- threaded Biomet LPS screw was inserted.  It was noted to have excellent purchase.  AP and lateral radiographs confirmed appropriate position and length of the hardware and appropriate reduction of the first TMT joint. Wound was irrigated copiously.  The joint capsule was repaired with simple sutures of 3-0  Monocryl.  The subcutaneous tissues were approximated with inverted simple sutures of 3-0 Monocryl and a running 3-0 nylon was used to close the skin incision.  Sterile dressings were applied followed by a well-padded short-leg splint.  Tourniquet was released after application of the dressings at 41 minutes.  The patient was awakened from anesthesia and transported to the recovery room in stable condition.  FOLLOWUP PLAN:  The patient will be nonweightbearing on the left lower extremity.  She will take aspirin for DVT prophylaxis.  She will follow up  with me in 2 weeks for suture removal and conversion to a short-leg cast.  Alfredo Martinez, PA-C was present and scrubbed for the duration of the case.  His assistance was essential in positioning the patient, prepping and draping, gaining and maintaining exposure, performing the operation, closing and dressing the wounds, and applying the splint.  RADIOGRAPHS:  AP and lateral radiographs of the foot were obtained intraoperatively.  These show interval arthrodesis of the first TMT joint.  No other acute injuries are noted.  Hardware is appropriately positioned and of the appropriate length.     Toni Arthurs, MD     JH/MEDQ  D:  11/07/2014  T:  11/08/2014  Job:  [id number]

## 2014-11-27 IMAGING — CR DG CHEST 2V
2 series · 2 of 2 positions shown · non-contrast
Comparison: None.

CLINICAL DATA: 47-year-old female preoperative study for right knee
surgery.  Headache, arthritis.

CHEST - 2 VIEW

[w chest pa]
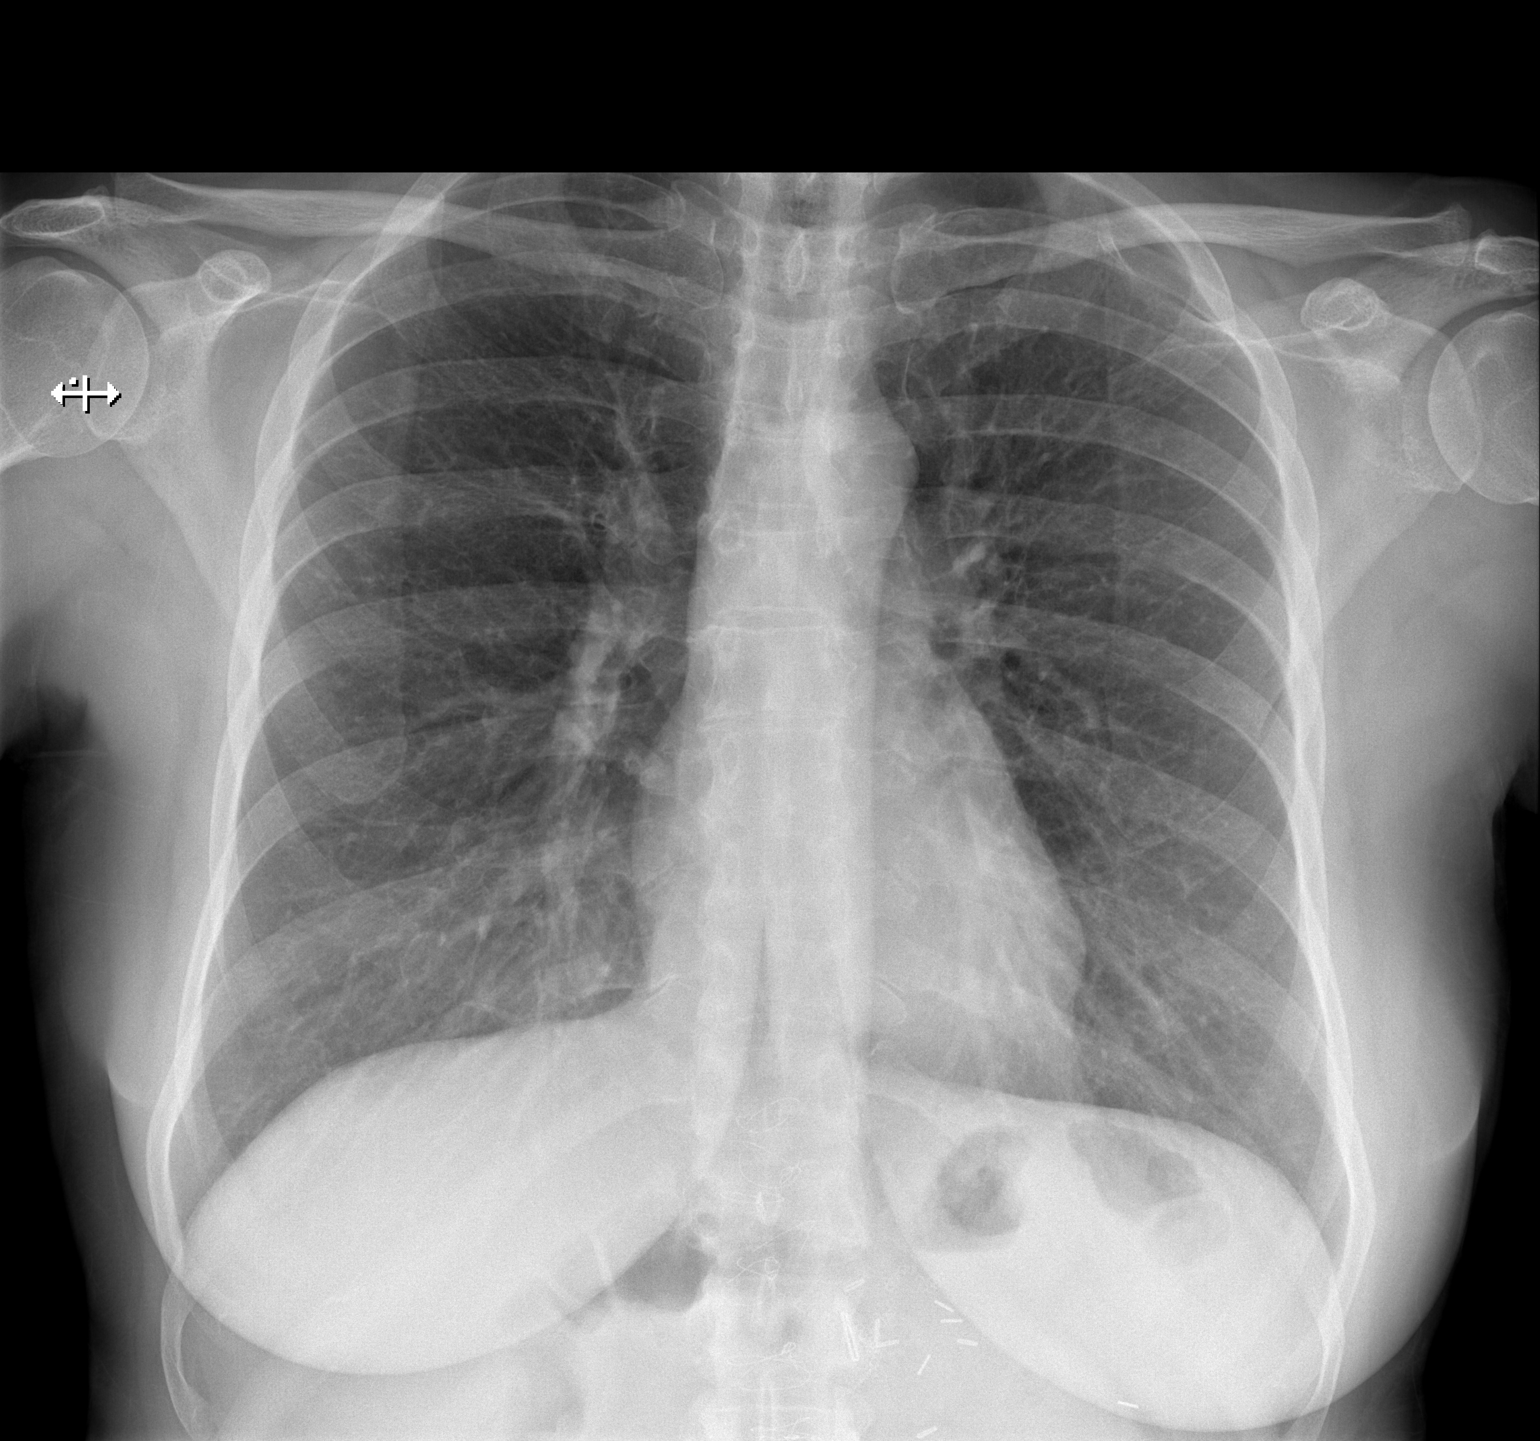

[w chest lat]
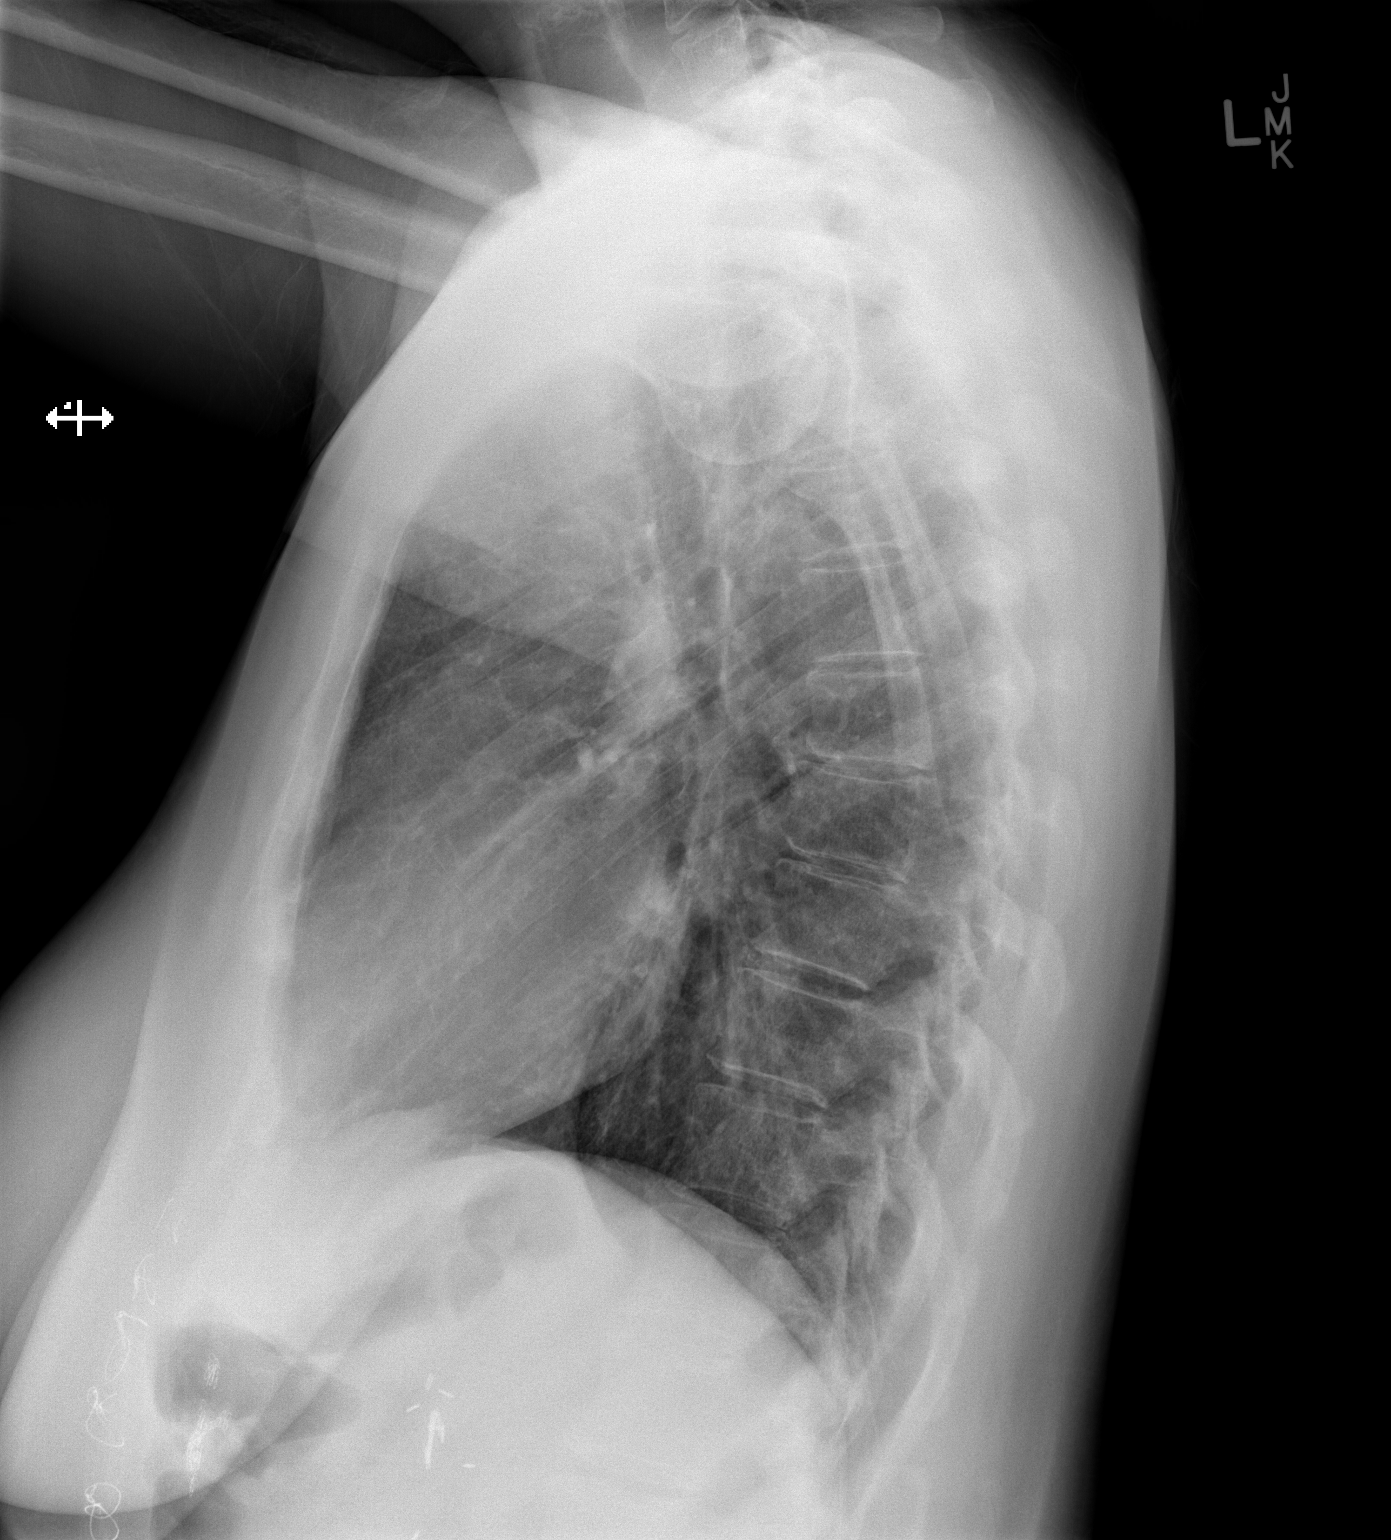

[2 of 2 positions shown; findings below may reference images not displayed]

FINDINGS: Lung volumes are within normal limits. Normal cardiac
size and mediastinal contours.  Visualized tracheal air column is
within normal limits.  The lungs are clear.  No pneumothorax or
effusion.  Postoperative changes to the ventral lower chest/upper
abdomen and left upper quadrant. No acute osseous abnormality
identified.
IMPRESSION: Negative, no acute cardiopulmonary abnormality.

## 2015-09-02 ENCOUNTER — Other Ambulatory Visit: Payer: Self-pay | Admitting: Family Medicine

## 2015-09-02 ENCOUNTER — Other Ambulatory Visit (HOSPITAL_COMMUNITY)
Admission: RE | Admit: 2015-09-02 | Discharge: 2015-09-02 | Disposition: A | Discharge status: Home / Self Care | Payer: 59 | Source: Ambulatory Visit | Attending: Family Medicine | Admitting: Family Medicine

## 2015-09-02 DIAGNOSIS — Z1151 Encounter for screening for human papillomavirus (HPV): Secondary | ICD-10-CM | POA: Diagnosis not present

## 2015-09-02 DIAGNOSIS — Z01411 Encounter for gynecological examination (general) (routine) with abnormal findings: Secondary | ICD-10-CM | POA: Insufficient documentation

## 2015-09-04 LAB — CYTOLOGY - PAP

## 2017-06-20 DIAGNOSIS — M13871 Other specified arthritis, right ankle and foot: Secondary | ICD-10-CM | POA: Diagnosis not present

## 2017-06-20 DIAGNOSIS — M25571 Pain in right ankle and joints of right foot: Secondary | ICD-10-CM | POA: Diagnosis not present

## 2019-03-01 ENCOUNTER — Other Ambulatory Visit: Payer: Self-pay | Admitting: Orthopaedic Surgery

## 2019-03-01 DIAGNOSIS — M19071 Primary osteoarthritis, right ankle and foot: Secondary | ICD-10-CM

## 2021-10-20 DIAGNOSIS — Z0000 Encounter for general adult medical examination without abnormal findings: Secondary | ICD-10-CM | POA: Diagnosis not present

## 2021-10-20 DIAGNOSIS — Z13220 Encounter for screening for lipoid disorders: Secondary | ICD-10-CM | POA: Diagnosis not present

## 2022-11-02 ENCOUNTER — Other Ambulatory Visit (HOSPITAL_BASED_OUTPATIENT_CLINIC_OR_DEPARTMENT_OTHER): Payer: Self-pay | Admitting: Family Medicine

## 2022-11-02 DIAGNOSIS — E7800 Pure hypercholesterolemia, unspecified: Secondary | ICD-10-CM

## 2022-12-06 ENCOUNTER — Ambulatory Visit (HOSPITAL_BASED_OUTPATIENT_CLINIC_OR_DEPARTMENT_OTHER)
Admission: RE | Admit: 2022-12-06 | Discharge: 2022-12-06 | Disposition: A | Discharge status: Home / Self Care | Payer: 59 | Source: Ambulatory Visit | Attending: Family Medicine | Admitting: Family Medicine

## 2022-12-06 DIAGNOSIS — E7800 Pure hypercholesterolemia, unspecified: Secondary | ICD-10-CM | POA: Insufficient documentation
